# Patient Record
Sex: Male | Born: 1985 | Race: Black or African American | Hispanic: No | Marital: Married | Smoking: Current some day smoker
Health system: Southern US, Community
[De-identification: ages and names within clinical notes are randomized; demographics above are authoritative.]

---
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
## 2004-03-17 ENCOUNTER — Emergency Department (HOSPITAL_COMMUNITY): Admission: EM | Admit: 2004-03-17 | Discharge: 2004-03-17 | Payer: Self-pay

## 2004-05-11 ENCOUNTER — Emergency Department (HOSPITAL_COMMUNITY): Admission: EM | Admit: 2004-05-11 | Discharge: 2004-05-11 | Payer: Self-pay | Admitting: Emergency Medicine

## 2004-05-12 ENCOUNTER — Emergency Department (HOSPITAL_COMMUNITY): Admission: EM | Admit: 2004-05-12 | Discharge: 2004-05-12 | Payer: Self-pay | Admitting: Family Medicine

## 2004-08-07 ENCOUNTER — Emergency Department (HOSPITAL_COMMUNITY): Admission: EM | Admit: 2004-08-07 | Discharge: 2004-08-07 | Payer: Self-pay | Admitting: Family Medicine

## 2004-08-26 ENCOUNTER — Emergency Department (HOSPITAL_COMMUNITY): Admission: EM | Admit: 2004-08-26 | Discharge: 2004-08-26 | Payer: Self-pay | Admitting: Emergency Medicine

## 2005-02-01 ENCOUNTER — Emergency Department (HOSPITAL_COMMUNITY): Admission: EM | Admit: 2005-02-01 | Discharge: 2005-02-01 | Payer: Self-pay | Admitting: Emergency Medicine

## 2005-03-21 ENCOUNTER — Emergency Department (HOSPITAL_COMMUNITY): Admission: EM | Admit: 2005-03-21 | Discharge: 2005-03-21 | Payer: Self-pay | Admitting: Family Medicine

## 2005-11-25 ENCOUNTER — Emergency Department (HOSPITAL_COMMUNITY): Admission: EM | Admit: 2005-11-25 | Discharge: 2005-11-25 | Payer: Self-pay | Admitting: Family Medicine

## 2005-12-30 ENCOUNTER — Emergency Department (HOSPITAL_COMMUNITY): Admission: EM | Admit: 2005-12-30 | Discharge: 2005-12-30 | Payer: Self-pay | Admitting: Family Medicine

## 2006-05-15 ENCOUNTER — Emergency Department (HOSPITAL_COMMUNITY): Admission: EM | Admit: 2006-05-15 | Discharge: 2006-05-15 | Payer: Self-pay | Admitting: Family Medicine

## 2006-07-31 ENCOUNTER — Emergency Department (HOSPITAL_COMMUNITY): Admission: EM | Admit: 2006-07-31 | Discharge: 2006-07-31 | Payer: Self-pay | Admitting: Family Medicine

## 2006-12-11 ENCOUNTER — Emergency Department (HOSPITAL_COMMUNITY): Admission: EM | Admit: 2006-12-11 | Discharge: 2006-12-11 | Payer: Self-pay | Admitting: Family Medicine

## 2007-03-18 ENCOUNTER — Emergency Department (HOSPITAL_COMMUNITY): Admission: EM | Admit: 2007-03-18 | Discharge: 2007-03-18 | Payer: Self-pay | Admitting: Family Medicine

## 2007-07-01 ENCOUNTER — Emergency Department (HOSPITAL_COMMUNITY): Admission: EM | Admit: 2007-07-01 | Discharge: 2007-07-01 | Payer: Self-pay | Admitting: Emergency Medicine

## 2007-08-30 ENCOUNTER — Emergency Department (HOSPITAL_COMMUNITY): Admission: EM | Admit: 2007-08-30 | Discharge: 2007-08-30 | Payer: Self-pay | Admitting: Family Medicine

## 2007-11-08 ENCOUNTER — Emergency Department (HOSPITAL_COMMUNITY): Admission: EM | Admit: 2007-11-08 | Discharge: 2007-11-08 | Payer: Self-pay | Admitting: Emergency Medicine

## 2009-07-06 ENCOUNTER — Emergency Department (HOSPITAL_COMMUNITY): Admission: EM | Admit: 2009-07-06 | Discharge: 2009-07-06 | Payer: Self-pay | Admitting: Family Medicine

## 2009-07-25 ENCOUNTER — Emergency Department (HOSPITAL_COMMUNITY): Admission: EM | Admit: 2009-07-25 | Discharge: 2009-07-25 | Payer: Self-pay | Admitting: Family Medicine

## 2009-10-10 ENCOUNTER — Emergency Department (HOSPITAL_COMMUNITY): Admission: EM | Admit: 2009-10-10 | Discharge: 2009-10-10 | Payer: Self-pay | Admitting: Family Medicine

## 2010-04-22 ENCOUNTER — Emergency Department (HOSPITAL_COMMUNITY): Admission: EM | Admit: 2010-04-22 | Discharge: 2010-04-22 | Payer: Self-pay | Admitting: Emergency Medicine

## 2010-10-03 LAB — POCT H PYLORI SCREEN: H. PYLORI SCREEN, POC: POSITIVE — AB

## 2010-10-10 LAB — GC/CHLAMYDIA PROBE AMP, GENITAL: Chlamydia, DNA Probe: NEGATIVE

## 2010-10-18 LAB — GC/CHLAMYDIA PROBE AMP, GENITAL: GC Probe Amp, Genital: NEGATIVE

## 2011-01-01 ENCOUNTER — Inpatient Hospital Stay (INDEPENDENT_AMBULATORY_CARE_PROVIDER_SITE_OTHER)
Admission: RE | Admit: 2011-01-01 | Discharge: 2011-01-01 | Disposition: A | Discharge status: Home / Self Care | Payer: Medicaid Other | Source: Ambulatory Visit | Attending: Family Medicine | Admitting: Family Medicine

## 2011-01-01 DIAGNOSIS — K2990 Gastroduodenitis, unspecified, without bleeding: Secondary | ICD-10-CM

## 2011-01-01 DIAGNOSIS — J309 Allergic rhinitis, unspecified: Secondary | ICD-10-CM

## 2011-10-05 ENCOUNTER — Emergency Department (HOSPITAL_COMMUNITY)
Admission: EM | Admit: 2011-10-05 | Discharge: 2011-10-05 | Disposition: A | Discharge status: Home / Self Care | Payer: Medicaid Other | Source: Home / Self Care

## 2011-10-05 ENCOUNTER — Encounter (HOSPITAL_COMMUNITY): Payer: Self-pay | Admitting: Emergency Medicine

## 2011-10-05 DIAGNOSIS — J209 Acute bronchitis, unspecified: Secondary | ICD-10-CM

## 2011-10-05 DIAGNOSIS — K5289 Other specified noninfective gastroenteritis and colitis: Secondary | ICD-10-CM

## 2011-10-05 DIAGNOSIS — K529 Noninfective gastroenteritis and colitis, unspecified: Secondary | ICD-10-CM

## 2011-10-05 MED ORDER — ONDANSETRON HCL 4 MG/2ML IJ SOLN
4.0000 mg | Freq: Once | INTRAMUSCULAR | Status: AC
  Administered 2011-10-05: 4 mg via INTRAMUSCULAR

## 2011-10-05 MED ORDER — ONDANSETRON 8 MG PO TBDP: 8.0000 mg | ORAL_TABLET | Freq: Three times a day (TID) | ORAL | Status: AC | PRN

## 2011-10-05 MED ORDER — ONDANSETRON HCL 4 MG/2ML IJ SOLN
INTRAMUSCULAR | Status: AC
  Filled 2011-10-05: qty 2

## 2011-10-05 MED ORDER — AZITHROMYCIN 250 MG PO TABS: ORAL_TABLET | ORAL | Status: AC

## 2011-10-05 NOTE — ED Notes (Signed)
PO challenge initiated

## 2011-10-05 NOTE — ED Provider Notes (Signed)
History     CSN: [id number]  Arrival date & time 10/05/11  1910   None     Chief Complaint  Patient presents with  . Emesis  . Abdominal Pain    (Consider location/radiation/quality/duration/timing/severity/associated sxs/prior treatment) HPI Comments: Patient initially presents with complaints of nausea, vomiting, and abdominal pain for 2-3 days. He denied diarrhea. Then upon further questioning and history he admits that his symptoms began today. He has vomited 4 times, and has had 3 loose stools as well. No fever or chills. He is unable to retain any fluids. He did eat pizza and subs last night for dinner and wonders if this is contributing to his symptoms. He is also concerned about a cough that he has had for the last 1-2 weeks. He admits that he is a smoker. The cough is sometimes productive with purulent sputum.    Past Medical History  Diagnosis Date  . Asthma   . Chronic bronchitis     History reviewed. No pertinent past surgical history.  No family history on file.  History  Substance Use Topics  . Smoking status: Current Everyday Smoker -- 0.5 packs/day for 0 years    Types: Cigarettes  . Smokeless tobacco: Not on file  . Alcohol Use: Yes      Review of Systems  Constitutional: Negative for fever, chills and fatigue.  HENT: Negative for ear pain, sore throat, rhinorrhea, postnasal drip and sinus pressure.   Respiratory: Positive for cough. Negative for shortness of breath and wheezing.   Cardiovascular: Negative for chest pain.  Gastrointestinal: Positive for nausea, vomiting, abdominal pain and diarrhea.  Genitourinary: Negative for decreased urine volume.  Neurological: Positive for light-headedness. Negative for headaches.    Allergies  Seasonal  Home Medications   Current Outpatient Rx  Name Route Sig Dispense Refill  . AZITHROMYCIN 250 MG PO TABS  take 2 tabs today, then 1 tab daily x 4 days 6 each 0  . ONDANSETRON 8 MG PO TBDP Oral Take 1  tablet (8 mg total) by mouth every 8 (eight) hours as needed for nausea. 20 tablet 0    BP 98/66  Pulse 94  Temp(Src) 97.5 F (36.4 C) (Oral)  Resp 20  SpO2 96%  Physical Exam  Nursing note and vitals reviewed. Constitutional: He appears well-developed and well-nourished. No distress.       When I initially entered the exam room pt was vomiting. When vomiting episode stopped he was sitting on exam table, calm, alert and talkative.   HENT:  Head: Normocephalic and atraumatic.  Right Ear: Tympanic membrane, external ear and ear canal normal.  Left Ear: Tympanic membrane, external ear and ear canal normal.  Nose: Nose normal.  Mouth/Throat: Uvula is midline, oropharynx is clear and moist and mucous membranes are normal. No oropharyngeal exudate, posterior oropharyngeal edema or posterior oropharyngeal erythema.  Neck: Neck supple.  Cardiovascular: Normal rate, regular rhythm and normal heart sounds.   Pulmonary/Chest: Effort normal and breath sounds normal. No respiratory distress.  Abdominal: Soft. Bowel sounds are normal. He exhibits no distension and no mass. There is tenderness (mild, diffuse). There is no rebound and no guarding.  Lymphadenopathy:    He has no cervical adenopathy.  Neurological: He is alert.  Skin: Skin is warm and dry.  Psychiatric: He has a normal mood and affect.    ED Course  Procedures (including critical care time)  Labs Reviewed - No data to display No results found.   1. Gastroenteritis  2. Acute bronchitis       MDM   Pt reports significant improvement of nausea after Zofran. Tolerating po fluids.        Melody Comas, [place] 10/05/11 2129

## 2011-10-05 NOTE — ED Notes (Signed)
Pt complains of nausea and vomiting for two days.

## 2011-10-05 NOTE — Discharge Instructions (Signed)
Take Zofran as needed for nausea and vomiting. Clear fluids. It is important to stay well hydrated. You may begin a light bland diet as symptoms improve. Wait 24 hrs to begin the antibiotic prescription due to the vomiting you've had today. It may upset your stomach. Return if symptoms change or worsen. Stop smoking!

## 2011-10-05 NOTE — ED Notes (Signed)
Pt tolerated PO intake of ginger ale. No vomiting or dry heaving since given Zofran.

## 2011-10-06 NOTE — ED Provider Notes (Signed)
Medical screening examination/treatment/procedure(s) were performed by resident physician or non-physician practitioner and as supervising physician I was immediately available for consultation/collaboration.   Barkley Bruns MD.    Linna Hoff, MD 10/06/11 2112

## 2015-06-03 ENCOUNTER — Emergency Department (HOSPITAL_BASED_OUTPATIENT_CLINIC_OR_DEPARTMENT_OTHER): Payer: Medicaid Other

## 2015-06-03 ENCOUNTER — Encounter (HOSPITAL_BASED_OUTPATIENT_CLINIC_OR_DEPARTMENT_OTHER): Payer: Self-pay | Admitting: Emergency Medicine

## 2015-06-03 ENCOUNTER — Emergency Department (HOSPITAL_BASED_OUTPATIENT_CLINIC_OR_DEPARTMENT_OTHER)
Admission: EM | Admit: 2015-06-03 | Discharge: 2015-06-03 | Disposition: A | Discharge status: Home / Self Care | Payer: Medicaid Other | Attending: Emergency Medicine | Admitting: Emergency Medicine

## 2015-06-03 DIAGNOSIS — Z202 Contact with and (suspected) exposure to infections with a predominantly sexual mode of transmission: Secondary | ICD-10-CM | POA: Diagnosis not present

## 2015-06-03 DIAGNOSIS — R0981 Nasal congestion: Secondary | ICD-10-CM | POA: Insufficient documentation

## 2015-06-03 DIAGNOSIS — F17210 Nicotine dependence, cigarettes, uncomplicated: Secondary | ICD-10-CM | POA: Diagnosis not present

## 2015-06-03 DIAGNOSIS — R05 Cough: Secondary | ICD-10-CM | POA: Diagnosis present

## 2015-06-03 DIAGNOSIS — J45909 Unspecified asthma, uncomplicated: Secondary | ICD-10-CM | POA: Insufficient documentation

## 2015-06-03 MED ORDER — LIDOCAINE HCL (PF) 1 % IJ SOLN
INTRAMUSCULAR | Status: AC
  Administered 2015-06-03: 1.2 mL
  Filled 2015-06-03: qty 5

## 2015-06-03 MED ORDER — FLUTICASONE PROPIONATE 50 MCG/ACT NA SUSP: 2.0000 | Freq: Every day | NASAL | Status: DC

## 2015-06-03 MED ORDER — CEFTRIAXONE SODIUM 250 MG IJ SOLR
250.0000 mg | Freq: Once | INTRAMUSCULAR | Status: AC
  Administered 2015-06-03: 250 mg via INTRAMUSCULAR
  Filled 2015-06-03: qty 250

## 2015-06-03 MED ORDER — AZITHROMYCIN 250 MG PO TABS
1000.0000 mg | ORAL_TABLET | Freq: Once | ORAL | Status: AC
  Administered 2015-06-03: 1000 mg via ORAL
  Filled 2015-06-03: qty 4

## 2015-06-03 NOTE — Discharge Instructions (Signed)
Sexually Transmitted Disease °A sexually transmitted disease (STD) is a disease or infection that may be passed (transmitted) from person to person, usually during sexual activity. This may happen by way of saliva, semen, blood, vaginal mucus, or urine. Common STDs include: °· Gonorrhea. °· Chlamydia. °· Syphilis. °· HIV and AIDS. °· Genital herpes. °· Hepatitis B and C. °· Trichomonas. °· Human papillomavirus (HPV). °· Pubic lice. °· Scabies. °· Mites. °· Bacterial vaginosis. °WHAT ARE CAUSES OF STDs? °An STD may be caused by bacteria, a virus, or parasites. STDs are often transmitted during sexual activity if one person is infected. However, they may also be transmitted through nonsexual means. STDs may be transmitted after:  °· Sexual intercourse with an infected person. °· Sharing sex toys with an infected person. °· Sharing needles with an infected person or using unclean piercing or tattoo needles. °· Having intimate contact with the genitals, mouth, or rectal areas of an infected person. °· Exposure to infected fluids during birth. °WHAT ARE THE SIGNS AND SYMPTOMS OF STDs? °Different STDs have different symptoms. Some people may not have any symptoms. If symptoms are present, they may include: °· Painful or bloody urination. °· Pain in the pelvis, abdomen, vagina, anus, throat, or eyes. °· A skin rash, itching, or irritation. °· Growths, ulcerations, blisters, or sores in the genital and anal areas. °· Abnormal vaginal discharge with or without bad odor. °· Penile discharge in men. °· Fever. °· Pain or bleeding during sexual intercourse. °· Swollen glands in the groin area. °· Yellow skin and eyes (jaundice). This is seen with hepatitis. °· Swollen testicles. °· Infertility. °· Sores and blisters in the mouth. °HOW ARE STDs DIAGNOSED? °To make a diagnosis, your health care provider may: °· Take a medical history. °· Perform a physical exam. °· Take a sample of any discharge to examine. °· Swab the throat,  cervix, opening to the penis, rectum, or vagina for testing. °· Test a sample of your first morning urine. °· Perform blood tests. °· Perform a Pap test, if this applies. °· Perform a colposcopy. °· Perform a laparoscopy. °HOW ARE STDs TREATED? °Treatment depends on the STD. Some STDs may be treated but not cured. °· Chlamydia, gonorrhea, trichomonas, and syphilis can be cured with antibiotic medicine. °· Genital herpes, hepatitis, and HIV can be treated, but not cured, with prescribed medicines. The medicines lessen symptoms. °· Genital warts from HPV can be treated with medicine or by freezing, burning (electrocautery), or surgery. Warts may come back. °· HPV cannot be cured with medicine or surgery. However, abnormal areas may be removed from the cervix, vagina, or vulva. °· If your diagnosis is confirmed, your recent sexual partners need treatment. This is true even if they are symptom-free or have a negative culture or evaluation. They should not have sex until their health care providers say it is okay. °· Your health care provider may test you for infection again 3 months after treatment. °HOW CAN I REDUCE MY RISK OF GETTING AN STD? °Take these steps to reduce your risk of getting an STD: °· Use latex condoms, dental dams, and water-soluble lubricants during sexual activity. Do not use petroleum jelly or oils. °· Avoid having multiple sex partners. °· Do not have sex with someone who has other sex partners °· Do not have sex with anyone you do not know or who is at high risk for an STD. °· Avoid risky sex practices that can break your skin. °· Do not have sex   if you have open sores on your mouth or skin. °· Avoid drinking too much alcohol or taking illegal drugs. Alcohol and drugs can affect your judgment and put you in a vulnerable position. °· Avoid engaging in oral and anal sex acts. °· Get vaccinated for HPV and hepatitis. If you have not received these vaccines in the past, talk to your health care  provider about whether one or both might be right for you. °· If you are at risk of being infected with HIV, it is recommended that you take a prescription medicine daily to prevent HIV infection. This is called pre-exposure prophylaxis (PrEP). You are considered at risk if: °¨ You are a man who has sex with other men (MSM). °¨ You are a heterosexual man or woman and are sexually active with more than one partner. °¨ You take drugs by injection. °¨ You are sexually active with a partner who has HIV. °· Talk with your health care provider about whether you are at high risk of being infected with HIV. If you choose to begin PrEP, you should first be tested for HIV. You should then be tested every 3 months for as long as you are taking PrEP. °WHAT SHOULD I DO IF I THINK I HAVE AN STD? °· See your health care provider. °· Tell your sexual partner(s). They should be tested and treated for any STDs. °· Do not have sex until your health care provider says it is okay. °WHEN SHOULD I GET IMMEDIATE MEDICAL CARE? °Contact your health care provider right away if:  °· You have severe abdominal pain. °· You are a man and notice swelling or pain in your testicles. °· You are a woman and notice swelling or pain in your vagina. °  °This information is not intended to replace advice given to you by your health care provider. Make sure you discuss any questions you have with your health care provider. °  °Document Released: 09/24/2002 Document Revised: 07/25/2014 Document Reviewed: 01/22/2013 °Elsevier Interactive Patient Education ©2016 [employer] ° °

## 2015-06-03 NOTE — ED Notes (Signed)
29 yo with cough and congestion for 3 days. States yellow productive cough and runny nose. Has not taken any OTC meds

## 2015-06-03 NOTE — ED Provider Notes (Signed)
CSN: [id number]     Arrival date & time 06/03/15  2051 History  By signing my name below, I, Soijett Blue, attest that this documentation has been prepared under the direction and in the presence of Lyndal Pulleyaniel Erionna Strum, MD. Electronically Signed: Soijett Blue, ED Scribe. 06/03/2015. 10:30 PM.   Chief Complaint  Patient presents with  . Cough      Patient is a 29 y.o. male presenting with cough. The history is provided by the patient. No language interpreter was used.  Cough Cough characteristics:  Productive Sputum characteristics:  Yellow Severity:  Moderate Onset quality:  Sudden Duration:  3 days Progression:  Unchanged Chronicity:  Recurrent Relieved by:  None tried Worsened by:  Nothing tried Ineffective treatments:  None tried Associated symptoms: sinus congestion     Robert KosRobert A Lutz is a 29 y.o. male with a medical hx of asthma, chronic bronchitis, who presents to the Emergency Department complaining of exposure to STD. He notes that he had sex recently with unprotected sex 2 times within the past 2 weeks. He notes that he would like to make sure that he doesn't have anything at this time. He denies any associated symptoms at this time. He denies penile pain/swelling, testicular pain/swelling, abdominal pain, n/v, and any other symptoms.   Past Medical History  Diagnosis Date  . Asthma   . Chronic bronchitis    History reviewed. No pertinent past surgical history. No family history on file. Social History  Substance Use Topics  . Smoking status: Current Some Day Smoker -- 0.50 packs/day for 0 years    Types: Cigarettes  . Smokeless tobacco: None  . Alcohol Use: Yes    Review of Systems  Respiratory: Positive for cough.   Genitourinary: Negative for dysuria, hematuria, discharge, penile swelling, scrotal swelling, penile pain and testicular pain.  All other systems reviewed and are negative.    Allergies  Review of patient's allergies indicates no active  allergies.  Home Medications   Prior to Admission medications   Not on File   BP 121/68 mmHg  Pulse 78  Temp(Src) 98.2 F (36.8 C)  Resp 18  Ht 5\' 7"  (1.702 m)  Wt 175 lb (79.379 kg)  BMI 27.40 kg/m2  SpO2 99% Physical Exam  Constitutional: He is oriented to person, place, and time. He appears well-developed and well-nourished. No distress.  HENT:  Head: Normocephalic and atraumatic.  Mouth/Throat: Uvula is midline, oropharynx is clear and moist and mucous membranes are normal.  Eyes: EOM are normal.  Neck: Neck supple.  Cardiovascular: Normal rate, regular rhythm and normal heart sounds.  Exam reveals no gallop and no friction rub.   No murmur heard. Pulmonary/Chest: Effort normal and breath sounds normal. No respiratory distress. He has no wheezes. He has no rales.  Abdominal: Soft. There is no tenderness.  Musculoskeletal: Normal range of motion.  Neurological: He is alert and oriented to person, place, and time.  Skin: Skin is warm and dry.  Psychiatric: He has a normal mood and affect. His behavior is normal.  Nursing note and vitals reviewed.   ED Course  Procedures (including critical care time) DIAGNOSTIC STUDIES: Oxygen Saturation is 99% on RA, nl by my interpretation.    COORDINATION OF CARE: 10:19 PM Discussed treatment plan with pt at bedside which includes nasal spray, zyrtec, and pt agreed to plan.    Labs Review Labs Reviewed  GC/CHLAMYDIA PROBE AMP ([place]    Imaging Review Dg Chest 2 View  06/03/2015  CLINICAL DATA:  Cough and chest congestion for 2 days EXAM: CHEST  2 VIEW COMPARISON:  11/08/2007 FINDINGS: The heart size and mediastinal contours are within normal limits. Both lungs are clear. The visualized skeletal structures are unremarkable. IMPRESSION: No active cardiopulmonary disease. Electronically Signed   By: Myles Rosenthal M.D.   On: 06/03/2015 22:07   I have personally reviewed and evaluated these images as part of my  medical decision-making.   EKG Interpretation None      MDM   Final diagnoses:  Nasal congestion  Exposure to STD    29 year old male presents with ongoing nasal congestion since starting smoking and mild cough. Chest x-ray from triage is unremarkable. He is afebrile and otherwise well-appearing. He is secondary complaint that he may have been exposed to an STD with breakage of the condom recently. He is a symptomatically currently but his partner has symptoms so he was covered empirically with antibiotics for GC and chlamydia. He was given nasal spray to help with his congestion symptoms. Patient needs to establish primary care in the area and was provided contact information to do so.    I personally performed the services described in this documentation, which was scribed in my presence. The recorded information has been reviewed and is accurate.       Lyndal Pulley, MD 06/04/15 [phone]

## 2015-06-04 LAB — GC/CHLAMYDIA PROBE AMP (~~LOC~~) NOT AT ARMC
Chlamydia: NEGATIVE
Neisseria Gonorrhea: NEGATIVE

## 2016-06-29 ENCOUNTER — Emergency Department (HOSPITAL_COMMUNITY)
Admission: EM | Admit: 2016-06-29 | Discharge: 2016-06-29 | Disposition: A | Discharge status: Home / Self Care | Payer: Medicaid Other | Attending: Emergency Medicine | Admitting: Emergency Medicine

## 2016-06-29 ENCOUNTER — Emergency Department (HOSPITAL_COMMUNITY): Payer: Medicaid Other

## 2016-06-29 ENCOUNTER — Encounter (HOSPITAL_COMMUNITY): Payer: Self-pay | Admitting: Emergency Medicine

## 2016-06-29 DIAGNOSIS — F17210 Nicotine dependence, cigarettes, uncomplicated: Secondary | ICD-10-CM | POA: Diagnosis not present

## 2016-06-29 DIAGNOSIS — L02214 Cutaneous abscess of groin: Secondary | ICD-10-CM | POA: Insufficient documentation

## 2016-06-29 DIAGNOSIS — J45909 Unspecified asthma, uncomplicated: Secondary | ICD-10-CM | POA: Insufficient documentation

## 2016-06-29 DIAGNOSIS — R1904 Left lower quadrant abdominal swelling, mass and lump: Secondary | ICD-10-CM | POA: Diagnosis present

## 2016-06-29 DIAGNOSIS — R52 Pain, unspecified: Secondary | ICD-10-CM

## 2016-06-29 LAB — CBC WITH DIFFERENTIAL/PLATELET
Basophils Absolute: 0 10*3/uL (ref 0.0–0.1)
Basophils Relative: 0 %
EOS PCT: 1 %
Eosinophils Absolute: 0.1 10*3/uL (ref 0.0–0.7)
HCT: 42.7 % (ref 39.0–52.0)
Hemoglobin: 14.5 g/dL (ref 13.0–17.0)
LYMPHS ABS: 1.6 10*3/uL (ref 0.7–4.0)
LYMPHS PCT: 15 %
MCH: 31 pg (ref 26.0–34.0)
MCHC: 34 g/dL (ref 30.0–36.0)
MCV: 91.4 fL (ref 78.0–100.0)
MONO ABS: 0.9 10*3/uL (ref 0.1–1.0)
Monocytes Relative: 8 %
Neutro Abs: 8.2 10*3/uL — ABNORMAL HIGH (ref 1.7–7.7)
Neutrophils Relative %: 76 %
PLATELETS: 312 10*3/uL (ref 150–400)
RBC: 4.67 MIL/uL (ref 4.22–5.81)
RDW: 13.3 % (ref 11.5–15.5)
WBC: 10.9 10*3/uL — ABNORMAL HIGH (ref 4.0–10.5)

## 2016-06-29 LAB — BASIC METABOLIC PANEL
Anion gap: 9 (ref 5–15)
BUN: 10 mg/dL (ref 6–20)
CALCIUM: 9.6 mg/dL (ref 8.9–10.3)
CO2: 26 mmol/L (ref 22–32)
Chloride: 102 mmol/L (ref 101–111)
Creatinine, Ser: 0.86 mg/dL (ref 0.61–1.24)
GFR calc Af Amer: >60 mL/min (ref >60)
GLUCOSE: 98 mg/dL (ref 65–99)
POTASSIUM: 3.7 mmol/L (ref 3.5–5.1)
Sodium: 137 mmol/L (ref 135–145)

## 2016-06-29 MED ORDER — HYDROMORPHONE HCL 1 MG/ML IJ SOLN: 0.5000 mg | Freq: Once | INTRAMUSCULAR | Status: DC

## 2016-06-29 MED ORDER — HYDROCODONE-ACETAMINOPHEN 5-325 MG PO TABS: ORAL_TABLET | ORAL | 0 refills | Status: DC

## 2016-06-29 MED ORDER — LIDOCAINE HCL 1 % IJ SOLN
INTRAMUSCULAR | Status: AC
  Administered 2016-06-29: 20 mL
  Filled 2016-06-29: qty 20

## 2016-06-29 MED ORDER — SULFAMETHOXAZOLE-TRIMETHOPRIM 800-160 MG PO TABS
1.0000 | ORAL_TABLET | Freq: Once | ORAL | Status: AC
  Administered 2016-06-29: 1 via ORAL
  Filled 2016-06-29: qty 1

## 2016-06-29 MED ORDER — ONDANSETRON 4 MG PO TBDP: 2.0000 mg | ORAL_TABLET | Freq: Once | ORAL | Status: DC

## 2016-06-29 MED ORDER — SULFAMETHOXAZOLE-TRIMETHOPRIM 800-160 MG PO TABS: 2.0000 | ORAL_TABLET | Freq: Two times a day (BID) | ORAL | 0 refills | Status: DC

## 2016-06-29 MED ORDER — LIDOCAINE HCL (PF) 1 % IJ SOLN: 5.0000 mL | Freq: Once | INTRAMUSCULAR | Status: DC

## 2016-06-29 NOTE — ED Notes (Signed)
Bed: WA10 Expected date:  Expected time:  Means of arrival:  Comments: Hold for triage  

## 2016-06-29 NOTE — ED Triage Notes (Signed)
Per pt, states abscess on left scrotum-states he noticed 2 days ago

## 2016-06-29 NOTE — ED Provider Notes (Signed)
[facility]-EMERGENCY DEPT Provider Note   CSN: [id number] Arrival date & time: 06/29/16  1043     History   Chief Complaint Chief Complaint  Patient presents with  . scrotal abscess    HPI   Blood pressure 125/71, pulse 74, temperature 98.2 F (36.8 C), temperature source Oral, resp. rate 16, height 5\' 8"  (1.727 m), weight 79.4 kg, SpO2 100 %.  Robert Lutz is a 30 y.o. male complaining of Painful swelling to the left inguinal area worsening over the course of 3 days, patient reports tactile fever, severe pain. No active drainage. He denies testicular pain or swelling urethral discharge, concerns about STDs no change in bowel or bladder habits and no abdominal pain.   Past Medical History:  Diagnosis Date  . Asthma   . Chronic bronchitis     There are no active problems to display for this patient.   History reviewed. No pertinent surgical history.     Home Medications    Prior to Admission medications   Medication Sig Start Date End Date Taking? Authorizing Provider  fluticasone (FLONASE) 50 MCG/ACT nasal spray Place 2 sprays into both nostrils daily. 06/03/15   Lyndal Pulley, MD  HYDROcodone-acetaminophen (NORCO/VICODIN) 5-325 MG tablet Take 1-2 tablets by mouth every 6 hours as needed for pain and/or cough. 06/29/16   Zakyra Kukuk, PA-C  sulfamethoxazole-trimethoprim (BACTRIM DS) 800-160 MG tablet Take 2 tablets by mouth 2 (two) times daily. 06/29/16   Joni Reining Makari Portman, PA-C    Family History No family history on file.  Social History Social History  Substance Use Topics  . Smoking status: Current Some Day Smoker    Packs/day: 0.50    Years: 0.00    Types: Cigarettes  . Smokeless tobacco: Not on file  . Alcohol use Yes     Allergies   Patient has no active allergies.   Review of Systems Review of Systems  10 systems reviewed and found to be negative, except as noted in the HPI.   Physical Exam Updated Vital Signs BP 125/71 (BP Location:  Left Arm)   Pulse 74   Temp 98.2 F (36.8 C) (Oral)   Resp 16   Ht 5\' 8"  (1.727 m)   Wt 79.4 kg   SpO2 100%   BMI 26.61 kg/m   Physical Exam  Constitutional: He is oriented to person, place, and time. He appears well-developed and well-nourished. No distress.  HENT:  Head: Normocephalic and atraumatic.  Mouth/Throat: Oropharynx is clear and moist.  Eyes: Conjunctivae and EOM are normal. Pupils are equal, round, and reactive to light.  Neck: Normal range of motion.  Cardiovascular: Normal rate, regular rhythm and intact distal pulses.   Pulmonary/Chest: Effort normal and breath sounds normal.  Abdominal: Soft. There is no tenderness.  Genitourinary:     Musculoskeletal: Normal range of motion.  Neurological: He is alert and oriented to person, place, and time.     Skin: He is not diaphoretic.  Psychiatric: He has a normal mood and affect.  Nursing note and vitals reviewed.    ED Treatments / Results  Labs (all labs ordered are listed, but only abnormal results are displayed) Labs Reviewed  CBC WITH DIFFERENTIAL/PLATELET - Abnormal; Notable for the following:       Result Value   WBC 10.9 (*)    Neutro Abs 8.2 (*)    All other components within normal limits  BASIC METABOLIC PANEL    EKG  EKG Interpretation None  Radiology [place] Scrotum  Result Date: 06/29/2016 CLINICAL DATA:  Pain and abscess EXAM: SCROTAL ULTRASOUND DOPPLER ULTRASOUND OF THE TESTICLES TECHNIQUE: Complete ultrasound examination of the testicles, epididymis, and other scrotal structures was performed. Color and spectral Doppler ultrasound were also utilized to evaluate blood flow to the testicles. COMPARISON:  None FINDINGS: Right testicle Measurements: 4.4 x 2.2 x 3.3 cm. No mass or microlithiasis visualized. Left testicle Measurements: 3.8 x 2.1 x 3.0 cm. No mass or microlithiasis visualized. Right epididymis:  Normal in size and appearance. Left epididymis:  Normal in size and appearance.  Hydrocele:  Small left hydrocele. Varicocele:  None visualized. Pulsed Doppler interrogation of both testes demonstrates normal low resistance arterial and venous waveforms bilaterally. There is a complex soft tissue area in the left groin, measuring 3.9 x 1.7 x 2.0 cm. It is difficult to characterize sonographically. This could reflect a complex fluid collection such as abscess or be related to a hernia. May consider further evaluation with CT. IMPRESSION: 3.9 cm complex fluid collection in the left groin superior to the left scrotum. Differential considerations would include a hernia for abscess. May consider further evaluation with CT. No testicular abnormality. Small left hydrocele. Electronically Signed   By: Charlett NoseKevin  Dover M.D.   On: 06/29/2016 12:47   [place] Art/ven Flow Abd Pelv Doppler  Result Date: 06/29/2016 CLINICAL DATA:  Pain and abscess EXAM: SCROTAL ULTRASOUND DOPPLER ULTRASOUND OF THE TESTICLES TECHNIQUE: Complete ultrasound examination of the testicles, epididymis, and other scrotal structures was performed. Color and spectral Doppler ultrasound were also utilized to evaluate blood flow to the testicles. COMPARISON:  None FINDINGS: Right testicle Measurements: 4.4 x 2.2 x 3.3 cm. No mass or microlithiasis visualized. Left testicle Measurements: 3.8 x 2.1 x 3.0 cm. No mass or microlithiasis visualized. Right epididymis:  Normal in size and appearance. Left epididymis:  Normal in size and appearance. Hydrocele:  Small left hydrocele. Varicocele:  None visualized. Pulsed Doppler interrogation of both testes demonstrates normal low resistance arterial and venous waveforms bilaterally. There is a complex soft tissue area in the left groin, measuring 3.9 x 1.7 x 2.0 cm. It is difficult to characterize sonographically. This could reflect a complex fluid collection such as abscess or be related to a hernia. May consider further evaluation with CT. IMPRESSION: 3.9 cm complex fluid collection in the left  groin superior to the left scrotum. Differential considerations would include a hernia for abscess. May consider further evaluation with CT. No testicular abnormality. Small left hydrocele. Electronically Signed   By: Charlett NoseKevin  Dover M.D.   On: 06/29/2016 12:47    Procedures .Marland Kitchen.Incision and Drainage Date/Time: 06/30/2016 7:53 AM Performed by: Wynetta EmeryPISCIOTTA, Delaynee Alred Authorized by: Wynetta EmeryPISCIOTTA, Torell Minder   Consent:    Consent obtained:  Verbal   Consent given by:  Patient   Alternatives discussed:  No treatment Location:    Type:  Abscess   Size:  3cm Anesthesia (see MAR for exact dosages):    Anesthesia method:  Local infiltration   Local anesthetic:  Bupivacaine 0.25% w/o epi Procedure type:    Complexity:  Complex Procedure details:    Needle aspiration: no     Incision types:  Elliptical   Scalpel blade:  11   Wound management:  Probed and deloculated, irrigated with saline and extensive cleaning   Drainage:  Purulent   Drainage amount:  Moderate   Wound treatment:  Wound left open   Packing materials:  None Post-procedure details:    Patient tolerance of procedure:  Tolerated well,  no immediate complications   (including critical care time)  Medications Ordered in ED Medications  lidocaine (XYLOCAINE) 1 % (with pres) injection (20 mLs  Given 06/29/16 1430)  sulfamethoxazole-trimethoprim (BACTRIM DS,SEPTRA DS) 800-160 MG per tablet 1 tablet (1 tablet Oral Given 06/29/16 1453)     Initial Impression / Assessment and Plan / ED Course  I have reviewed the triage vital signs and the nursing notes.  Pertinent labs & imaging results that were available during my care of the patient were reviewed by me and considered in my medical decision making (see chart for details).  Clinical Course     Vitals:   06/29/16 1104 06/29/16 1356 06/29/16 1459  BP: 112/72  125/71  Pulse: 87  74  Resp: 18  16  Temp: 97.8 F (36.6 C)  98.2 F (36.8 C)  TempSrc: Oral  Oral  SpO2: 100%  100%    Weight:  79.4 kg   Height:  5\' 8"  (1.727 m)     Medications  lidocaine (XYLOCAINE) 1 % (with pres) injection (20 mLs  Given 06/29/16 1430)  sulfamethoxazole-trimethoprim (BACTRIM DS,SEPTRA DS) 800-160 MG per tablet 1 tablet (1 tablet Oral Given 06/29/16 1453)    Robert Lutz is 30 y.o. male presenting with Inguinal abscess, not affecting the scrotum. Ultrasound with fluid collection in the left groin differential hernia versus abscess, clinically this is clearly an abscess, I and D performed and large amount of purulent material was obtained, patient feels much better after incision and drainage. Started on Bactrim and discussed wound care and return precautions.  Evaluation does not show pathology that would require ongoing emergent intervention or inpatient treatment. Pt is hemodynamically stable and mentating appropriately. Discussed findings and plan with patient/guardian, who agrees with care plan. All questions answered. Return precautions discussed and outpatient follow up given.      Final Clinical Impressions(s) / ED Diagnoses   Final diagnoses:  Inguinal abscess    New Prescriptions Discharge Medication List as of 06/29/2016  2:31 PM    START taking these medications   Details  HYDROcodone-acetaminophen (NORCO/VICODIN) 5-325 MG tablet Take 1-2 tablets by mouth every 6 hours as needed for pain and/or cough., Print    sulfamethoxazole-trimethoprim (BACTRIM DS) 800-160 MG tablet Take 2 tablets by mouth 2 (two) times daily., Starting Wed 06/29/2016, [employer]         Kaylee Wombles, PA-C 06/30/16 [id number]    Tilden FossaElizabeth Rees, MD 07/03/16 [phone]

## 2016-06-29 NOTE — Discharge Instructions (Signed)
Take vicodin for breakthrough pain, do not drink alcohol, drive, care for children or do other critical tasks while taking vicodin. ° °Please follow with your primary care doctor in the next 2 days for a check-up. They must obtain records for further management.  ° °Do not hesitate to return to the Emergency Department for any new, worsening or concerning symptoms.  ° °

## 2016-06-29 NOTE — ED Notes (Signed)
Patient verbally abusive to staff-complaining about wait time-disrespectful to radiology staff-when RN attempted to explain ED process/s patient cursed and made an inappropriate face-unresponsive to redirection

## 2016-06-29 NOTE — ED Notes (Signed)
No answer for blood draw @ this time. 

## 2018-01-09 ENCOUNTER — Emergency Department (HOSPITAL_COMMUNITY)
Admission: EM | Admit: 2018-01-09 | Discharge: 2018-01-09 | Disposition: A | Discharge status: Home / Self Care | Payer: Medicaid Other | Attending: Emergency Medicine | Admitting: Emergency Medicine

## 2018-01-09 ENCOUNTER — Encounter (HOSPITAL_COMMUNITY): Payer: Self-pay | Admitting: Emergency Medicine

## 2018-01-09 ENCOUNTER — Other Ambulatory Visit: Payer: Self-pay

## 2018-01-09 DIAGNOSIS — M542 Cervicalgia: Secondary | ICD-10-CM

## 2018-01-09 DIAGNOSIS — T148XXA Other injury of unspecified body region, initial encounter: Secondary | ICD-10-CM

## 2018-01-09 DIAGNOSIS — Y929 Unspecified place or not applicable: Secondary | ICD-10-CM | POA: Diagnosis not present

## 2018-01-09 DIAGNOSIS — J45909 Unspecified asthma, uncomplicated: Secondary | ICD-10-CM | POA: Diagnosis not present

## 2018-01-09 DIAGNOSIS — Y998 Other external cause status: Secondary | ICD-10-CM | POA: Insufficient documentation

## 2018-01-09 DIAGNOSIS — F17210 Nicotine dependence, cigarettes, uncomplicated: Secondary | ICD-10-CM | POA: Insufficient documentation

## 2018-01-09 DIAGNOSIS — S40812A Abrasion of left upper arm, initial encounter: Secondary | ICD-10-CM | POA: Diagnosis not present

## 2018-01-09 DIAGNOSIS — Y939 Activity, unspecified: Secondary | ICD-10-CM | POA: Diagnosis not present
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
## 2018-01-09 NOTE — Discharge Instructions (Signed)
You may alternate taking Tylenol and Ibuprofen as needed for pain control. You may take 400-600 mg of ibuprofen every 6 hours and [phone] mg of Tylenol every 6 hours. Do not exceed 4000 mg of Tylenol daily as this can lead to liver damage. Also, make sure to take Ibuprofen with meals as it can cause an upset stomach. Do not take other NSAIDs while taking Ibuprofen such as (Aleve, Naprosyn, Aspirin, Celebrex, etc) and do not take more than the prescribed dose as this can lead to ulcers and bleeding in your GI tract. You may use warm and cold compresses to help with your symptoms.   Please follow the instructions for wound care as outlined in your discharge paperwork for your abrasion to your arm.  Please follow up with your primary doctor within the next 7-10 days for re-evaluation and further treatment of your symptoms.   Please return to the ER sooner if you have any new or worsening symptoms.

## 2019-08-31 ENCOUNTER — Other Ambulatory Visit: Payer: Self-pay

## 2019-08-31 ENCOUNTER — Emergency Department (HOSPITAL_COMMUNITY): Payer: Medicaid Other

## 2019-08-31 ENCOUNTER — Emergency Department (HOSPITAL_COMMUNITY)
Admission: EM | Admit: 2019-08-31 | Discharge: 2019-08-31 | Disposition: A | Discharge status: Home / Self Care | Payer: Medicaid Other | Attending: Emergency Medicine | Admitting: Emergency Medicine

## 2019-08-31 ENCOUNTER — Encounter (HOSPITAL_COMMUNITY): Payer: Self-pay | Admitting: Emergency Medicine

## 2019-08-31 DIAGNOSIS — Z79899 Other long term (current) drug therapy: Secondary | ICD-10-CM | POA: Insufficient documentation

## 2019-08-31 DIAGNOSIS — F17210 Nicotine dependence, cigarettes, uncomplicated: Secondary | ICD-10-CM | POA: Insufficient documentation

## 2019-08-31 DIAGNOSIS — Y93I9 Activity, other involving external motion: Secondary | ICD-10-CM | POA: Insufficient documentation

## 2019-08-31 DIAGNOSIS — J45909 Unspecified asthma, uncomplicated: Secondary | ICD-10-CM | POA: Insufficient documentation

## 2019-08-31 DIAGNOSIS — Y999 Unspecified external cause status: Secondary | ICD-10-CM | POA: Insufficient documentation

## 2019-08-31 DIAGNOSIS — S0990XA Unspecified injury of head, initial encounter: Secondary | ICD-10-CM | POA: Diagnosis present

## 2019-08-31 DIAGNOSIS — S060X0A Concussion without loss of consciousness, initial encounter: Secondary | ICD-10-CM | POA: Diagnosis not present

## 2019-08-31 DIAGNOSIS — Y92410 Unspecified street and highway as the place of occurrence of the external cause: Secondary | ICD-10-CM | POA: Insufficient documentation

## 2019-08-31 NOTE — Discharge Instructions (Addendum)
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
## 2019-12-20 ENCOUNTER — Other Ambulatory Visit: Payer: Self-pay

## 2019-12-20 ENCOUNTER — Encounter (HOSPITAL_COMMUNITY): Payer: Self-pay

## 2019-12-20 ENCOUNTER — Encounter: Payer: Self-pay | Admitting: Gastroenterology

## 2019-12-20 ENCOUNTER — Ambulatory Visit (HOSPITAL_COMMUNITY)
Admission: EM | Admit: 2019-12-20 | Discharge: 2019-12-20 | Disposition: A | Discharge status: Home / Self Care | Payer: Medicaid Other | Attending: Family Medicine | Admitting: Family Medicine

## 2019-12-20 DIAGNOSIS — Z8619 Personal history of other infectious and parasitic diseases: Secondary | ICD-10-CM | POA: Diagnosis not present

## 2019-12-20 NOTE — Discharge Instructions (Signed)
Need to have repeat testing for H. Pylori This needs to be done 30 days after treatment at least 2 weeks after finishing your omeprazole You can either have a breath test, a stool test, or see a gastroenterologist We're unable to do the testing at this office. I will give you the number of a GI doctor to help you I have included information regarding an eating plan for your brain health as we discussed Avoid alcohol and cigarette smoking

## 2019-12-20 NOTE — ED Triage Notes (Signed)
Pt presents with follow up for bacterial infection: pt states he was seen at [facility] about a month ago and treated for H.Pylori and completed antibiotics.

## 2019-12-20 NOTE — ED Provider Notes (Signed)
[facility]    CSN: [id number] Arrival date & time: 12/20/19  1204      History   Chief Complaint Chief Complaint  Patient presents with  . Follow-up    HPI LORAN FLEET is a 34 y.o. male.   HPI  Mr. Baccam is a healthy 34 year old male He states that he usually goes to [facility] urgent care for his medical needs He states he went there for a sinus infection a couple months back.  He mentioned at the time he was having heartburn.  They did a breath urea test and it was positive.  He was told he had H. pylori and was given a course of antibiotics. He went back for follow-up a month ago.  They retested him, and he is positive on his bathroom test second time.  He was treated with a second course of antibiotics.  He finished these 3 days ago he is unable to tell me whether he had different antibiotics the first time in the second time, and he is unable to tell me what medicines he took but does feel like one of them was omeprazole He feels well.  No heartburn or GI symptoms, no abdominal pain, no weight loss   Past Medical History:  Diagnosis Date  . Asthma   . Chronic bronchitis     There are no problems to display for this patient.   History reviewed. No pertinent surgical history.     Home Medications    Prior to Admission medications   Medication Sig Start Date End Date Taking? Authorizing Provider  fluticasone (FLONASE) 50 MCG/ACT nasal spray Place 2 sprays into both nostrils daily. 06/03/15 12/20/19  Leo Grosser, MD    Family History Family History  Family history unknown: Yes    Social History Social History   Tobacco Use  . Smoking status: Current Some Day Smoker    Packs/day: 0.50    Years: 0.00    Pack years: 0.00    Types: Cigarettes  . Smokeless tobacco: Never Used  Substance Use Topics  . Alcohol use: Yes  . Drug use: Yes    Types: Marijuana     Allergies   Patient has no known allergies.   Review of Systems Review of  Systems  Gastrointestinal: Negative for abdominal pain, nausea and vomiting.     Physical Exam Triage Vital Signs ED Triage Vitals  Enc Vitals Group     BP 12/20/19 1235 132/82     Pulse Rate 12/20/19 1235 100     Resp 12/20/19 1235 18     Temp 12/20/19 1235 98.6 F (37 C)     Temp Source 12/20/19 1235 Oral     SpO2 12/20/19 1235 99 %     Weight --      Height --      Head Circumference --      Peak Flow --      Pain Score 12/20/19 1233 3     Pain Loc --      Pain Edu? --      Excl. in [place]? --    No data found.  Updated Vital Signs BP 132/82 (BP Location: Right Arm)   Pulse 100   Temp 98.6 F (37 C) (Oral)   Resp 18   SpO2 99%      Physical Exam Constitutional:      General: He is not in acute distress.    Appearance: He is well-developed.  HENT:  Head: Normocephalic and atraumatic.     Mouth/Throat:     Comments: Mask is in place Eyes:     Conjunctiva/sclera: Conjunctivae normal.     Pupils: Pupils are equal, round, and reactive to light.  Cardiovascular:     Rate and Rhythm: Tachycardia present.     Heart sounds: Normal heart sounds.  Pulmonary:     Effort: Pulmonary effort is normal. No respiratory distress.     Breath sounds: Normal breath sounds.  Musculoskeletal:        General: Normal range of motion.     Cervical back: Normal range of motion.  Skin:    General: Skin is warm and dry.  Neurological:     Mental Status: He is alert.  Psychiatric:        Mood and Affect: Mood normal.        Behavior: Behavior normal.      UC Treatments / Results  Labs (all labs ordered are listed, but only abnormal results are displayed) Labs Reviewed - No data to display  EKG   Radiology No results found.  Procedures Procedures (including critical care time)  Medications Ordered in UC Medications - No data to display  Initial Impression / Assessment and Plan / UC Course  I have reviewed the triage vital signs and the nursing notes.  Pertinent  labs & imaging results that were available during my care of the patient were reviewed by me and considered in my medical decision making (see chart for details).      We reviewed the benefits of smoking cessation With recurring H. pylori I also recommend he do not drink alcohol I am referring him to gastroenterology Stop all GI medications at this time so repeat testing can be done in a month  Final Clinical Impressions(s) / UC Diagnoses   Final diagnoses:  History of Helicobacter pylori infection     Discharge Instructions     Need to have repeat testing for H. Pylori This needs to be done 30 days after treatment at least 2 weeks after finishing your omeprazole You can either have a breath test, a stool test, or see a gastroenterologist We're unable to do the testing at this office. I will give you the number of a GI doctor to help you I have included information regarding an eating plan for your brain health as we discussed Avoid alcohol and cigarette smoking   ED Prescriptions    None     PDMP not reviewed this encounter.   Eustace Moore, MD 12/20/19 [phone]

## 2020-02-03 ENCOUNTER — Other Ambulatory Visit: Payer: Self-pay

## 2020-02-03 DIAGNOSIS — Z8619 Personal history of other infectious and parasitic diseases: Secondary | ICD-10-CM

## 2020-02-03 DIAGNOSIS — J45909 Unspecified asthma, uncomplicated: Secondary | ICD-10-CM | POA: Insufficient documentation

## 2020-02-03 DIAGNOSIS — J42 Unspecified chronic bronchitis: Secondary | ICD-10-CM | POA: Insufficient documentation

## 2020-02-03 HISTORY — DX: Personal history of other infectious and parasitic diseases: Z86.19

## 2020-02-05 ENCOUNTER — Ambulatory Visit: Payer: Medicaid Other | Admitting: Gastroenterology

## 2020-08-05 ENCOUNTER — Encounter (HOSPITAL_COMMUNITY): Payer: Self-pay

## 2020-08-05 ENCOUNTER — Ambulatory Visit (HOSPITAL_COMMUNITY)
Admission: EM | Admit: 2020-08-05 | Discharge: 2020-08-05 | Disposition: A | Discharge status: Home / Self Care | Payer: Medicaid Other | Attending: Emergency Medicine | Admitting: Emergency Medicine

## 2020-08-05 DIAGNOSIS — L03211 Cellulitis of face: Secondary | ICD-10-CM | POA: Diagnosis not present

## 2020-08-05 MED ORDER — DOXYCYCLINE HYCLATE 100 MG PO CAPS: 100.0000 mg | ORAL_CAPSULE | Freq: Two times a day (BID) | ORAL | 0 refills | Status: DC

## 2020-08-05 MED ORDER — IBUPROFEN 800 MG PO TABS
800.0000 mg | ORAL_TABLET | Freq: Once | ORAL | Status: AC
  Administered 2020-08-05: 800 mg via ORAL

## 2020-08-05 MED ORDER — IBUPROFEN 800 MG PO TABS: 800.0000 mg | ORAL_TABLET | Freq: Three times a day (TID) | ORAL | 0 refills | Status: DC | PRN

## 2020-08-05 MED ORDER — IBUPROFEN 800 MG PO TABS
ORAL_TABLET | ORAL | Status: AC
  Filled 2020-08-05: qty 1

## 2020-08-05 NOTE — ED Triage Notes (Signed)
Pt c/o upper lip swelling for 2 days, then spread to frenum, nostrils, c/o pain to left side of head/face, neck for approx 2 days. Reports intense pain to lip and nares area.  Reports recent post-nasal drip, runny nose.   Pt denies difficulty swallowing, SOB, nausea/abdominal pain, fever, n/v, cough.

## 2020-08-05 NOTE — Discharge Instructions (Addendum)
Apply heat, as able, if this can drain that will help it heal.  Ibuprofen as needed for pain, take with food, don't take for another 8 hours.  Complete course of antibiotics.  Follow up with ENT if persistent, or return for any worsening of symptoms.

## 2020-08-05 NOTE — ED Provider Notes (Signed)
[facility]    CSN: [id number] Arrival date & time: 08/05/20  0909      History   Chief Complaint Chief Complaint  Patient presents with  . Facial Swelling    HPI Robert Lutz is a 35 y.o. male.   Robert Lutz presents with complaints of upper lip and nasal pain and swelling which he first noted two days ago and has increased. No drainage. Denies any previous similar. No known fevers. No headache. No known MRSA. Has been taking alkaselzer which hasn't helped. Swelling is to upper lip at frenulum into distal nasal septum, primarily to left inner nares.     ROS per HPI, negative if not otherwise mentioned.      Past Medical History:  Diagnosis Date  . Asthma   . Chronic bronchitis   . History of Helicobacter pylori infection 02/03/2020    Patient Active Problem List   Diagnosis Date Noted  . History of Helicobacter pylori infection 02/03/2020  . Asthma 02/03/2020  . Chronic bronchitis (HCC) 02/03/2020    History reviewed. No pertinent surgical history.     Home Medications    Prior to Admission medications   Medication Sig Start Date End Date Taking? Authorizing Provider  doxycycline (VIBRAMYCIN) 100 MG capsule Take 1 capsule (100 mg total) by mouth 2 (two) times daily. 08/05/20  Yes Leomar Westberg, Dorene Grebe B, NP  ibuprofen (ADVIL) 800 MG tablet Take 1 tablet (800 mg total) by mouth every 8 (eight) hours as needed for mild pain or moderate pain. 08/05/20  Yes Rachit Grim, Barron Alvine, NP  fluticasone (FLONASE) 50 MCG/ACT nasal spray Place 2 sprays into both nostrils daily. 06/03/15 12/20/19  Lyndal Pulley, MD    Family History Family History  Family history unknown: Yes    Social History Social History   Tobacco Use  . Smoking status: Current Some Day Smoker    Packs/day: 0.50    Years: 0.00    Pack years: 0.00    Types: Cigarettes  . Smokeless tobacco: Never Used  Substance Use Topics  . Alcohol use: Yes  . Drug use: Yes    Types: Marijuana      Allergies   Patient has no known allergies.   Review of Systems Review of Systems   Physical Exam Triage Vital Signs ED Triage Vitals  Enc Vitals Group     BP 08/05/20 0939 136/72     Pulse Rate 08/05/20 0939 78     Resp 08/05/20 0939 18     Temp 08/05/20 0939 98.5 F (36.9 C)     Temp Source 08/05/20 0939 Oral     SpO2 08/05/20 0939 100 %     Weight 08/05/20 0938 190 lb (86.2 kg)     Height 08/05/20 0938 5\' 9"  (1.753 m)     Head Circumference --      Peak Flow --      Pain Score 08/05/20 0938 7     Pain Loc --      Pain Edu? --      Excl. in GC? --    No data found.  Updated Vital Signs BP 136/72 (BP Location: Right Arm)   Pulse 78   Temp 98.5 F (36.9 C) (Oral)   Resp 18   Ht 5\' 9"  (1.753 m)   Wt 190 lb (86.2 kg)   SpO2 100%   BMI 28.06 kg/m   Visual Acuity Right Eye Distance:   Left Eye Distance:   Bilateral Distance:  Right Eye Near:   Left Eye Near:    Bilateral Near:     Physical Exam Constitutional:      Appearance: He is well-developed.  HENT:     Nose: Nasal tenderness present.      Comments: Firm, red, swollen to upper lip to left inner nares and septum; no active drainage, no visible pustule Cardiovascular:     Rate and Rhythm: Normal rate.  Pulmonary:     Effort: Pulmonary effort is normal.  Skin:    General: Skin is warm and dry.  Neurological:     Mental Status: He is alert and oriented to person, place, and time.      UC Treatments / Results  Labs (all labs ordered are listed, but only abnormal results are displayed) Labs Reviewed - No data to display  EKG   Radiology No results found.  Procedures Procedures (including critical care time)  Medications Ordered in UC Medications  ibuprofen (ADVIL) tablet 800 mg (has no administration in time range)    Initial Impression / Assessment and Plan / UC Course  I have reviewed the triage vital signs and the nursing notes.  Pertinent labs & imaging results that  were available during my care of the patient were reviewed by me and considered in my medical decision making (see chart for details).     Non toxi. Findings consistent with likely abscess and cellulitis at nose/ upper lip. Antibiotics provided with return precautions and ENT follow up recommendations discussed. Patient verbalized understanding and agreeable to plan.   Final Clinical Impressions(s) / UC Diagnoses   Final diagnoses:  Facial cellulitis     Discharge Instructions     Apply heat, as able, if this can drain that will help it heal.  Ibuprofen as needed for pain, take with food, don't take for another 8 hours.  Complete course of antibiotics.  Follow up with ENT if persistent, or return for any worsening of symptoms.    ED Prescriptions    Medication Sig Dispense Auth. Provider   doxycycline (VIBRAMYCIN) 100 MG capsule Take 1 capsule (100 mg total) by mouth 2 (two) times daily. 20 capsule Linus Mako B, NP   ibuprofen (ADVIL) 800 MG tablet Take 1 tablet (800 mg total) by mouth every 8 (eight) hours as needed for mild pain or moderate pain. 30 tablet Georgetta Haber, NP     PDMP not reviewed this encounter.   Georgetta Haber, NP 08/05/20 1156

## 2020-11-13 ENCOUNTER — Ambulatory Visit (HOSPITAL_COMMUNITY)
Admission: EM | Admit: 2020-11-13 | Discharge: 2020-11-13 | Disposition: A | Discharge status: Home / Self Care | Payer: Medicaid Other

## 2020-11-13 ENCOUNTER — Other Ambulatory Visit: Payer: Self-pay

## 2020-11-13 ENCOUNTER — Encounter (HOSPITAL_COMMUNITY): Payer: Self-pay

## 2020-11-13 DIAGNOSIS — G8929 Other chronic pain: Secondary | ICD-10-CM

## 2020-11-13 DIAGNOSIS — Z8619 Personal history of other infectious and parasitic diseases: Secondary | ICD-10-CM

## 2020-11-13 DIAGNOSIS — R1013 Epigastric pain: Secondary | ICD-10-CM

## 2020-11-13 NOTE — ED Provider Notes (Signed)
[facility]    CSN: [id number] Arrival date & time: 11/13/20  1301      History   Chief Complaint History of H. Pylori  HPI Robert Lutz is a 35 y.o. male.   HPI   Abdominal pain: Pt has a history of H. Pylori infection. He has had multiple positive urea breath tests and has had 2-3 round of treatment without resolution. He denies any new pain or symptoms. He reports that he has an appointment on Monday with GI but wanted to check ahead of time if he has the infection still. No bloody movements or vomiting.   Past Medical History:  Diagnosis Date  . Asthma   . Chronic bronchitis   . History of Helicobacter pylori infection 02/03/2020    Patient Active Problem List   Diagnosis Date Noted  . History of Helicobacter pylori infection 02/03/2020  . Asthma 02/03/2020  . Chronic bronchitis (HCC) 02/03/2020    History reviewed. No pertinent surgical history.     Home Medications    Prior to Admission medications   Medication Sig Start Date End Date Taking? Authorizing Provider  doxycycline (VIBRAMYCIN) 100 MG capsule Take 1 capsule (100 mg total) by mouth 2 (two) times daily. 08/05/20   Georgetta Haber, NP  ibuprofen (ADVIL) 800 MG tablet Take 1 tablet (800 mg total) by mouth every 8 (eight) hours as needed for mild pain or moderate pain. 08/05/20   Georgetta Haber, NP  fluticasone (FLONASE) 50 MCG/ACT nasal spray Place 2 sprays into both nostrils daily. 06/03/15 12/20/19  Lyndal Pulley, MD    Family History Family History  Family history unknown: Yes    Social History Social History   Tobacco Use  . Smoking status: Current Some Day Smoker    Packs/day: 0.50    Years: 0.00    Pack years: 0.00    Types: Cigarettes  . Smokeless tobacco: Never Used  Substance Use Topics  . Alcohol use: Yes  . Drug use: Yes    Types: Marijuana     Allergies   Patient has no known allergies.   Review of Systems Review of Systems  As stated above in  HPI Physical Exam Triage Vital Signs ED Triage Vitals [11/13/20 1334]  Enc Vitals Group     BP 138/79     Pulse Rate 66     Resp 16     Temp 99.3 F (37.4 C)     Temp Source Oral     SpO2 100 %     Weight 200 lb (90.7 kg)     Height      Head Circumference      Peak Flow      Pain Score 0     Pain Loc      Pain Edu?      Excl. in GC?    No data found.  Updated Vital Signs BP 138/79 (BP Location: Right Arm)   Pulse 66   Temp 99.3 F (37.4 C) (Oral)   Resp 16   Wt 200 lb (90.7 kg)   SpO2 100%   BMI 29.53 kg/m   Physical Exam Vitals and nursing note reviewed.  Constitutional:      General: He is not in acute distress.    Appearance: Normal appearance. He is not ill-appearing, toxic-appearing or diaphoretic.  Eyes:     Extraocular Movements: Extraocular movements intact.     Pupils: Pupils are equal, round, and reactive to light.  Comments: NO jaundice  Abdominal:     General: Abdomen is flat. Bowel sounds are normal. There is no distension.     Palpations: Abdomen is soft. There is no mass.     Tenderness: There is no abdominal tenderness. There is no right CVA tenderness, left CVA tenderness, guarding or rebound.     Hernia: No hernia is present.  Skin:    General: Skin is warm.     Coloration: Skin is not jaundiced or pale.  Neurological:     General: No focal deficit present.     Mental Status: He is alert and oriented to person, place, and time.      UC Treatments / Results  Labs (all labs ordered are listed, but only abnormal results are displayed) Labs Reviewed - No data to display  EKG   Radiology No results found.  Procedures Procedures (including critical care time)  Medications Ordered in UC Medications - No data to display  Initial Impression / Assessment and Plan / UC Course  I have reviewed the triage vital signs and the nursing notes.  Pertinent labs & imaging results that were available during my care of the patient were  reviewed by me and considered in my medical decision making (see chart for details).     New.  I discussed the limitations of testing in the urgent care setting as we do not have the urea breath test or stool testing capabilities.  The blood testing will likely be positive as he has already been positive in the past and I discussed this hindrance with patient.  At the moment he denies any significant symptoms and he would not like medication.  Advised that this be a no charge visit as there is nothing that I can help assist with him further other than encouraging him to keep his appointment on Monday with the specialist. Final Clinical Impressions(s) / UC Diagnoses   Final diagnoses:  History of Helicobacter pylori infection  Abdominal pain, chronic, epigastric     Discharge Instructions     Please set up an appointment with Gastroenterology for retesting and treatment given history of treatment failure.     ED Prescriptions    None     PDMP not reviewed this encounter.   Rushie Chestnut, [place] 11/13/20 1416

## 2020-11-13 NOTE — ED Triage Notes (Signed)
Patient presents to Urgent Care with complaints of a follow-up seen 6-8 months ago for h.pylori infection. He states the medications prescribed provided no relief and continues to have GI related issues.

## 2020-11-13 NOTE — Discharge Instructions (Signed)
Please set up an appointment with Gastroenterology for retesting and treatment given history of treatment failure.

## 2020-11-16 ENCOUNTER — Ambulatory Visit: Payer: Medicaid Other | Admitting: Gastroenterology

## 2020-12-02 ENCOUNTER — Other Ambulatory Visit: Payer: Self-pay

## 2020-12-02 ENCOUNTER — Encounter (HOSPITAL_COMMUNITY): Payer: Self-pay

## 2020-12-02 ENCOUNTER — Ambulatory Visit (HOSPITAL_COMMUNITY)
Admission: EM | Admit: 2020-12-02 | Discharge: 2020-12-02 | Disposition: A | Discharge status: Home / Self Care | Payer: Medicaid Other | Attending: Emergency Medicine | Admitting: Emergency Medicine

## 2020-12-02 DIAGNOSIS — H60391 Other infective otitis externa, right ear: Secondary | ICD-10-CM

## 2020-12-02 MED ORDER — CIPROFLOXACIN-DEXAMETHASONE 0.3-0.1 % OT SUSP: 4.0000 [drp] | Freq: Two times a day (BID) | OTIC | 0 refills | Status: DC

## 2020-12-02 NOTE — ED Triage Notes (Signed)
Pt in with c/o right ear pain x 3 days  Pt has been using ibuprofen for pain relief   Pt states pain is very sharp

## 2020-12-02 NOTE — Discharge Instructions (Addendum)
Instill 2 drops of antibiotic drops into your right ear twice for the next 7 days.    You can take Ibuprofen and/or Tylenol as needed for pain relief and fever reduction.     Return or go to the Emergency Department if symptoms worsen or do not improve in the next few days.

## 2020-12-02 NOTE — ED Provider Notes (Signed)
[facility]    CSN: [id number] Arrival date & time: 12/02/20  1536      History   Chief Complaint Chief Complaint  Patient presents with  . Otalgia  . Sore Throat    HPI Robert Lutz is a 35 y.o. male.   Patient here for evaluation of right ear pain that has been ongoing for the past 3 days.  Also reports having some right sided throat pain.  Has not taken any OTC medication or treatments.  Denies any trauma, injury, or other precipitating event.  Denies any specific alleviating or aggravating factors.  Denies any fevers, chest pain, shortness of breath, N/V/D, numbness, tingling, weakness, abdominal pain, or headaches.     The history is provided by the patient.    Past Medical History:  Diagnosis Date  . Asthma   . Chronic bronchitis   . History of Helicobacter pylori infection 02/03/2020    Patient Active Problem List   Diagnosis Date Noted  . History of Helicobacter pylori infection 02/03/2020  . Asthma 02/03/2020  . Chronic bronchitis (HCC) 02/03/2020    History reviewed. No pertinent surgical history.     Home Medications    Prior to Admission medications   Medication Sig Start Date End Date Taking? Authorizing Provider  ciprofloxacin-dexamethasone (CIPRODEX) OTIC suspension Place 4 drops into the right ear 2 (two) times daily. 12/02/20  Yes Ivette Loyal, NP  doxycycline (VIBRAMYCIN) 100 MG capsule Take 1 capsule (100 mg total) by mouth 2 (two) times daily. 08/05/20   Georgetta Haber, NP  ibuprofen (ADVIL) 800 MG tablet Take 1 tablet (800 mg total) by mouth every 8 (eight) hours as needed for mild pain or moderate pain. 08/05/20   Georgetta Haber, NP  fluticasone (FLONASE) 50 MCG/ACT nasal spray Place 2 sprays into both nostrils daily. 06/03/15 12/20/19  Lyndal Pulley, MD    Family History Family History  Family history unknown: Yes    Social History Social History   Tobacco Use  . Smoking status: Current Some Day Smoker     Packs/day: 0.50    Years: 0.00    Pack years: 0.00    Types: Cigarettes  . Smokeless tobacco: Never Used  Vaping Use  . Vaping Use: Never used  Substance Use Topics  . Alcohol use: Yes  . Drug use: Yes    Types: Marijuana     Allergies   Patient has no known allergies.   Review of Systems Review of Systems  HENT: Positive for ear discharge, ear pain and sore throat.   All other systems reviewed and are negative.    Physical Exam Triage Vital Signs ED Triage Vitals  Enc Vitals Group     BP 12/02/20 1627 125/72     Pulse Rate 12/02/20 1624 100     Resp 12/02/20 1624 19     Temp 12/02/20 1624 99 F (37.2 C)     Temp src --      SpO2 12/02/20 1624 99 %     Weight --      Height --      Head Circumference --      Peak Flow --      Pain Score 12/02/20 1623 8     Pain Loc --      Pain Edu? --      Excl. in GC? --    No data found.  Updated Vital Signs BP 125/72   Pulse 100   Temp 99  F (37.2 C)   Resp 19   SpO2 99%   Visual Acuity Right Eye Distance:   Left Eye Distance:   Bilateral Distance:    Right Eye Near:   Left Eye Near:    Bilateral Near:     Physical Exam Vitals and nursing note reviewed.  Constitutional:      General: He is not in acute distress.    Appearance: Normal appearance. He is not ill-appearing, toxic-appearing or diaphoretic.  HENT:     Head: Normocephalic and atraumatic.     Right Ear: Drainage, swelling and tenderness present. There is no impacted cerumen. No foreign body. No mastoid tenderness.     Left Ear: Tympanic membrane and ear canal normal. No drainage, swelling or tenderness. No foreign body. No mastoid tenderness.  Eyes:     Conjunctiva/sclera: Conjunctivae normal.  Cardiovascular:     Rate and Rhythm: Normal rate.     Pulses: Normal pulses.  Pulmonary:     Effort: Pulmonary effort is normal.  Abdominal:     General: Abdomen is flat.  Musculoskeletal:        General: Normal range of motion.     Cervical back:  Normal range of motion.  Skin:    General: Skin is warm and dry.  Neurological:     General: No focal deficit present.     Mental Status: He is alert and oriented to person, place, and time.  Psychiatric:        Mood and Affect: Mood normal.      UC Treatments / Results  Labs (all labs ordered are listed, but only abnormal results are displayed) Labs Reviewed - No data to display  EKG   Radiology No results found.  Procedures Procedures (including critical care time)  Medications Ordered in UC Medications - No data to display  Initial Impression / Assessment and Plan / UC Course  I have reviewed the triage vital signs and the nursing notes.  Pertinent labs & imaging results that were available during my care of the patient were reviewed by me and considered in my medical decision making (see chart for details).    Assessment negative for red flags or concerns.  Ciprodex 2 drops to the right ear twice a day for the next 7 days.  May take ibuprofen and/or Tylenol as needed for pain and fever.  Follow-up with primary care as needed. Final Clinical Impressions(s) / UC Diagnoses   Final diagnoses:  Other infective acute otitis externa of right ear     Discharge Instructions     Instill 2 drops of antibiotic drops into your right ear twice for the next 7 days.    You can take Ibuprofen and/or Tylenol as needed for pain relief and fever reduction.     Return or go to the Emergency Department if symptoms worsen or do not improve in the next few days.        ED Prescriptions    Medication Sig Dispense Auth. Provider   ciprofloxacin-dexamethasone (CIPRODEX) OTIC suspension Place 4 drops into the right ear 2 (two) times daily. 7.5 mL Ivette Loyal, NP     PDMP not reviewed this encounter.   Ivette Loyal, NP 12/02/20 [id number]

## 2020-12-24 ENCOUNTER — Ambulatory Visit: Payer: Medicaid Other | Admitting: Gastroenterology

## 2021-01-07 ENCOUNTER — Ambulatory Visit (HOSPITAL_COMMUNITY)
Admission: EM | Admit: 2021-01-07 | Discharge: 2021-01-07 | Disposition: A | Discharge status: Home / Self Care | Payer: Medicaid Other | Attending: Physician Assistant | Admitting: Physician Assistant

## 2021-01-07 ENCOUNTER — Other Ambulatory Visit: Payer: Self-pay

## 2021-01-07 ENCOUNTER — Encounter (HOSPITAL_COMMUNITY): Payer: Self-pay

## 2021-01-07 DIAGNOSIS — J40 Bronchitis, not specified as acute or chronic: Secondary | ICD-10-CM | POA: Insufficient documentation

## 2021-01-07 DIAGNOSIS — Z20822 Contact with and (suspected) exposure to covid-19: Secondary | ICD-10-CM | POA: Diagnosis not present

## 2021-01-07 DIAGNOSIS — R059 Cough, unspecified: Secondary | ICD-10-CM | POA: Insufficient documentation

## 2021-01-07 DIAGNOSIS — J329 Chronic sinusitis, unspecified: Secondary | ICD-10-CM | POA: Insufficient documentation

## 2021-01-07 MED ORDER — AMOXICILLIN-POT CLAVULANATE 875-125 MG PO TABS: 1.0000 | ORAL_TABLET | Freq: Two times a day (BID) | ORAL | 0 refills | Status: DC

## 2021-01-07 MED ORDER — PROMETHAZINE-DM 6.25-15 MG/5ML PO SYRP: 5.0000 mL | ORAL_SOLUTION | Freq: Every evening | ORAL | 0 refills | Status: DC | PRN

## 2021-01-07 NOTE — ED Provider Notes (Signed)
[facility]    CSN: [id number] Arrival date & time: 01/07/21  1221      History   Chief Complaint Chief Complaint  Patient presents with   c/o Sinus infection    HPI SHEMUEL Lutz is a 35 y.o. male.   Patient presents today with a 5-day history of URI symptoms that is worsened recently.  He is concerned that he might of been exposed to COVID-19 as his son recently tested positive and is requesting a COVID-19 test today.  He reports associated chills, night sweats, cough, intermittent shortness of breath, sinus pressure, congestion.  Denies any nausea, vomiting, chest pain, headaches, dizziness.  He has tried Mucinex without improvement of symptoms.  He has a history of allergies and asthma but is not taking medication for these conditions.  Denies any recent antibiotic use.  He has not had COVID-19 or influenza vaccinations.  Reports symptoms are severe and interfering with his ability to sleep through the night.  He is a former smoker.   Past Medical History:  Diagnosis Date   Asthma    Chronic bronchitis    History of Helicobacter pylori infection 02/03/2020    Patient Active Problem List   Diagnosis Date Noted   History of Helicobacter pylori infection 02/03/2020   Asthma 02/03/2020   Chronic bronchitis (HCC) 02/03/2020    History reviewed. No pertinent surgical history.     Home Medications    Prior to Admission medications   Medication Sig Start Date End Date Taking? Authorizing Provider  amoxicillin-clavulanate (AUGMENTIN) 875-125 MG tablet Take 1 tablet by mouth every 12 (twelve) hours. 01/07/21  Yes Aleasha Fregeau K, PA-C  promethazine-dextromethorphan (PROMETHAZINE-DM) 6.25-15 MG/5ML syrup Take 5 mLs by mouth at bedtime as needed for cough. 01/07/21  Yes Jolan Mealor K, PA-C  fluticasone (FLONASE) 50 MCG/ACT nasal spray Place 2 sprays into both nostrils daily. 06/03/15 12/20/19  Lyndal Pulley, MD    Family History Family History  Problem Relation Age  of Onset   Diabetes Mother    Diabetes Father     Social History Social History   Tobacco Use   Smoking status: Some Days    Packs/day: 0.50    Years: 0.00    Pack years: 0.00    Types: Cigarettes   Smokeless tobacco: Never  Vaping Use   Vaping Use: Never used  Substance Use Topics   Alcohol use: Not Currently   Drug use: Not Currently    Types: Marijuana     Allergies   Patient has no known allergies.   Review of Systems Review of Systems  Constitutional:  Negative for activity change, appetite change, fatigue and fever.  HENT:  Positive for congestion, postnasal drip and sinus pressure. Negative for sneezing and sore throat.   Respiratory:  Positive for cough and shortness of breath.   Cardiovascular:  Negative for chest pain.  Gastrointestinal:  Negative for abdominal pain, diarrhea, nausea and vomiting.  Musculoskeletal:  Negative for arthralgias and myalgias.  Neurological:  Negative for dizziness, light-headedness and headaches.    Physical Exam Triage Vital Signs ED Triage Vitals  Enc Vitals Group     BP 01/07/21 1401 116/72     Pulse Rate 01/07/21 1401 66     Resp 01/07/21 1401 20     Temp 01/07/21 1401 98.8 F (37.1 C)     Temp Source 01/07/21 1401 Oral     SpO2 01/07/21 1401 100 %     Weight --  Height --      Head Circumference --      Peak Flow --      Pain Score 01/07/21 1340 0     Pain Loc --      Pain Edu? --      Excl. in GC? --    No data found.  Updated Vital Signs BP 116/72 (BP Location: Left Arm)   Pulse 66   Temp 98.8 F (37.1 C) (Oral)   Resp 20   SpO2 100%   Visual Acuity Right Eye Distance:   Left Eye Distance:   Bilateral Distance:    Right Eye Near:   Left Eye Near:    Bilateral Near:     Physical Exam Vitals reviewed.  Constitutional:      General: He is awake.     Appearance: Normal appearance. He is normal weight. He is not ill-appearing.     Comments: Appears stated age no acute distress  HENT:      Head: Normocephalic and atraumatic.     Right Ear: Tympanic membrane, ear canal and external ear normal. Tympanic membrane is not erythematous or bulging.     Left Ear: Tympanic membrane, ear canal and external ear normal. Tympanic membrane is not erythematous or bulging.     Nose:     Right Sinus: Maxillary sinus tenderness present. No frontal sinus tenderness.     Left Sinus: Maxillary sinus tenderness present. No frontal sinus tenderness.     Mouth/Throat:     Pharynx: Uvula midline. Posterior oropharyngeal erythema present. No oropharyngeal exudate.     Comments: Moderate erythema and drainage in posterior oropharynx Cardiovascular:     Rate and Rhythm: Normal rate and regular rhythm.     Heart sounds: Normal heart sounds, S1 normal and S2 normal. No murmur heard. Pulmonary:     Effort: Pulmonary effort is normal. No accessory muscle usage or respiratory distress.     Breath sounds: Normal breath sounds. No stridor. No wheezing, rhonchi or rales.     Comments: Clear to auscultation bilaterally Abdominal:     General: Bowel sounds are normal.     Palpations: Abdomen is soft.     Tenderness: There is no abdominal tenderness.  Lymphadenopathy:     Head:     Right side of head: No submental, submandibular or tonsillar adenopathy.     Left side of head: No submental, submandibular or tonsillar adenopathy.     Cervical: No cervical adenopathy.  Neurological:     Mental Status: He is alert.  Psychiatric:        Behavior: Behavior is cooperative.     UC Treatments / Results  Labs (all labs ordered are listed, but only abnormal results are displayed) Labs Reviewed  SARS CORONAVIRUS 2 (TAT 6-24 HRS)    EKG   Radiology No results found.  Procedures Procedures (including critical care time)  Medications Ordered in UC Medications - No data to display  Initial Impression / Assessment and Plan / UC Course  I have reviewed the triage vital signs and the nursing  notes.  Pertinent labs & imaging results that were available during my care of the patient were reviewed by me and considered in my medical decision making (see chart for details).      Patient prescribed Augmentin twice daily for 7 days given prolonged and worsening symptoms.  COVID-19 test was collected as requested but discussed this would not change management given he has been symptomatic for 5 days.  He was prescribed promethazine DM for cough with instruction not to drive or drink alcohol with this medication as drowsiness is a common side effect.  Encouraged him to use over-the-counter medications for symptom management.  Discussed alarm symptoms that warrant emergent evaluation.  Strict return precautions given to which patient expressed understanding.  Final Clinical Impressions(s) / UC Diagnoses   Final diagnoses:  Sinobronchitis  Cough  Exposure to COVID-19 virus     Discharge Instructions      Take Augmentin twice daily for 7 days.  Use Promethazine DM at night for cough.  This make you sleepy so do not drive or drink alcohol with it.  Use Flonase and Mucinex for symptom relief.  We will contact you if your COVID-19 test is positive.  If you have any worsening symptoms please return for reevaluation.     ED Prescriptions     Medication Sig Dispense Auth. Provider   amoxicillin-clavulanate (AUGMENTIN) 875-125 MG tablet Take 1 tablet by mouth every 12 (twelve) hours. 14 tablet Adriene Padula K, PA-C   promethazine-dextromethorphan (PROMETHAZINE-DM) 6.25-15 MG/5ML syrup Take 5 mLs by mouth at bedtime as needed for cough. 118 mL Armin Yerger K, PA-C      PDMP not reviewed this encounter.   Jeani Hawking, PA-C 01/07/21 1417

## 2021-01-07 NOTE — ED Triage Notes (Signed)
C/o having sinus infection, yellow drainage, chills, sweating starting four days ago. No c/o pain noted. Patient stated he did take Mucinex today with little relief.

## 2021-01-07 NOTE — Discharge Instructions (Addendum)
Take Augmentin twice daily for 7 days.  Use Promethazine DM at night for cough.  This make you sleepy so do not drive or drink alcohol with it.  Use Flonase and Mucinex for symptom relief.  We will contact you if your COVID-19 test is positive.  If you have any worsening symptoms please return for reevaluation.

## 2021-01-08 LAB — SARS CORONAVIRUS 2 (TAT 6-24 HRS): SARS Coronavirus 2: POSITIVE — AB

## 2021-02-17 ENCOUNTER — Ambulatory Visit (INDEPENDENT_AMBULATORY_CARE_PROVIDER_SITE_OTHER): Payer: Medicaid Other | Admitting: Professional

## 2021-02-17 ENCOUNTER — Other Ambulatory Visit: Payer: Self-pay

## 2021-02-17 DIAGNOSIS — F331 Major depressive disorder, recurrent, moderate: Secondary | ICD-10-CM | POA: Diagnosis not present

## 2021-02-17 NOTE — Psych (Signed)
Virtual Visit via Video Note  I connected with Robert Lutz on 02/17/21 at  9:00 AM EDT by a video enabled telemedicine application and verified that I am speaking with the correct person using two identifiers.  Location: Patient: Set designer (parked) Provider: Clinical Home Office   I discussed the limitations of evaluation and management by telemedicine and the availability of in person appointments. The patient expressed understanding and agreed to proceed.  Follow Up Instructions:    I discussed the assessment and treatment plan with the patient. The patient was provided an opportunity to ask questions and all were answered. The patient agreed with the plan and demonstrated an understanding of the instructions.   The patient was advised to call back or seek an in-person evaluation if the symptoms worsen or if the condition fails to improve as anticipated.  I provided 45 minutes of non-face-to-face time during this encounter.   Quinn Axe, [facility]   Comprehensive Clinical Assessment (CCA) Note  02/17/2021 Robert Lutz [id number]  Chief Complaint:  Chief Complaint  Patient presents with   Medication Refill   Depression   Visit Diagnosis: MDD, Mod    CCA Screening, Triage and Referral (STR)  Patient Reported Information How did you hear about [place]? Other (Comment)  Referral name: [facility]  Referral phone number: No data recorded  Whom do you see for routine medical problems? Primary Care  Practice/Facility Name: [facility]  Practice/Facility Phone Number: No data recorded Name of Contact: No data recorded Contact Number: No data recorded Contact Fax Number: No data recorded Prescriber Name: No data recorded Prescriber Address (if known): No data recorded  What Is the Reason for Your Visit/Call Today? wants to get back on medication Seriquel; anger management  How Long Has This Been Causing You Problems? > than 6 months  What Do You Feel Would  Help You the Most Today? Medication(s); Treatment for Depression or other mood problem   Have You Recently Been in Any Inpatient Treatment (Hospital/Detox/Crisis Center/28-Day Program)? No  Name/Location of Program/Hospital:No data recorded How Long Were You There? No data recorded When Were You Discharged? No data recorded  Have You Ever Received Services From [facility] Before? No  Who Do You See at [facility]? No data recorded  Have You Recently Had Any Thoughts About Hurting Yourself? No  Are You Planning to Commit Suicide/Harm Yourself At This time? No   Have you Recently Had Thoughts About Hurting Someone Karolee Ohs? No  Explanation: No data recorded  Have You Used Any Alcohol or Drugs in the Past 24 Hours? No  How Long Ago Did You Use Drugs or Alcohol? No data recorded What Did You Use and How Much? No data recorded  Do You Currently Have a Therapist/Psychiatrist? No  Name of Therapist/Psychiatrist: No data recorded  Have You Been Recently Discharged From Any Office Practice or Programs? Yes  Explanation of Discharge From Practice/Program: [facility]- no shows     CCA Screening Triage Referral Assessment Type of Contact: Tele-Assessment  Is this Initial or Reassessment? Initial Assessment  Date Telepsych consult ordered in [facility]:  No data recorded Time Telepsych consult ordered in [facility]:  No data recorded  Patient Reported Information Reviewed? No data recorded Patient Left Without Being Seen? No data recorded Reason for Not Completing Assessment: No data recorded  Collateral Involvement: No data recorded  Does Patient Have a Court Appointed Legal Guardian? No data recorded Name and Contact of Legal Guardian: No data recorded If Minor  and Not Living with Parent(s), Who has Custody? No data recorded Is CPS involved or ever been involved? Never  Is APS involved or ever been involved? Never   Patient Determined To Be At Risk for Harm To Self or Others Based on  Review of Patient Reported Information or Presenting Complaint? No  Method: No data recorded Availability of Means: No data recorded Intent: No data recorded Notification Required: No data recorded Additional Information for Danger to Others Potential: No data recorded Additional Comments for Danger to Others Potential: No data recorded Are There Guns or Other Weapons in Your Home? No data recorded Types of Guns/Weapons: No data recorded Are These Weapons Safely Secured?                            No data recorded Who Could Verify You Are Able To Have These Secured: No data recorded Do You Have any Outstanding Charges, Pending Court Dates, Parole/Probation? No data recorded Contacted To Inform of Risk of Harm To Self or Others: No data recorded  [facility]   Does Patient Present under Involuntary Commitment? No  IVC Papers Initial File Date: No data recorded  [place]   Patient Currently Receiving the Following Services: Medication Management; Individual Therapy   Determination of Need: Routine (7 days)   Options For Referral: Medication Management; Outpatient Therapy     CCA Biopsychosocial Intake/Chief Complaint:  wants medication managment and anger management; "sometimes I feel depressed and I blame it on over thinking." "Sometimes I don't want to participate, leave the house, or get out of the bed. It shifts my attitude." Pt reports being off meds for 6-8 months "and I can see myself going back into my depression spells and the old person I was." Pt reports history of hearing voices "but I haven't heard voices in a while." Pt reports "I dealt a lot with that in 2021." Pt states "I really don't like to talk about the voices." Denies commands "it's just negative voices." Pt reprots trauma in past but declines to elaborate. Pt states "I want to manage myself correctly. I don't want to go to jail. I want to go back to  school. I never really thought about it being me that was the problem and now I realize it." Pt reports he has a 1yo son and is on SSI. Pt reports history of "traumatic childhood" but declines to discuss further. Pt denies drug use. Pt is hyper-focused on medications.  Current Symptoms/Problems: anger; depression; anxiety; sleep too little "my body is tired but my brain is wanting more."   Patient Reported Schizophrenia/Schizoaffective Diagnosis in Past: No   Strengths: knows what he wants  Preferences: medication management and therapy for anger management  Abilities: can attend and participate in treatment   Type of Services Patient Feels are Needed: medication and therapy   Initial Clinical Notes/Concerns: No data recorded  Mental Health Symptoms Depression:   Change in energy/activity; Hopelessness; Worthlessness; Tearfulness; Irritability; Sleep (too much or little)   Duration of Depressive symptoms:  Greater than two weeks   Mania:  No data recorded  Anxiety:    Irritability; Sleep; Worrying   Psychosis:   None   Duration of Psychotic symptoms: No data recorded  Trauma:   Emotional numbing   Obsessions:   None   Compulsions:   None   Inattention:   None   Hyperactivity/Impulsivity:   None  Oppositional/Defiant Behaviors:   None   Emotional Irregularity:   None   Other Mood/Personality Symptoms:  No data recorded   Mental Status Exam Appearance and self-care  Stature:   Average   Weight:   Average weight   Clothing:   Casual   Grooming:   Normal   Cosmetic use:   None   Posture/gait:   Normal   Motor activity:   Not Remarkable   Sensorium  Attention:   Normal   Concentration:   Normal   Orientation:   X5   Recall/memory:   Normal   Affect and Mood  Affect:   Depressed   Mood:   Depressed   Relating  Eye contact:   Fleeting   Facial expression:   Depressed   Attitude toward examiner:   Cooperative    Thought and Language  Speech flow:  Normal   Thought content:   Appropriate to Mood and Circumstances   Preoccupation:   None   Hallucinations:   None   Organization:  No data recorded  [employer] of Knowledge:   Good   Intelligence:   Average   Abstraction:   Normal   Judgement:   Fair   Reality Testing:   Adequate   Insight:   Fair   Decision Making:   Normal   Social Functioning  Social Maturity:   Responsible   Social Judgement:   Normal   Stress  Stressors:   Family conflict; Relationship; Transitions; Illness   Coping Ability:   Deficient supports   Skill Deficits:   None   Supports:   Family     Religion: Religion/Spirituality Are You A Religious Person?: Yes What is Your Religious Affiliation?: Muslim  Leisure/Recreation: Leisure / Recreation Do You Have Hobbies?: Yes Leisure and Hobbies: compose music  Exercise/Diet: Exercise/Diet Do You Exercise?: No Have You Gained or Lost A Significant Amount of Weight in the Past Six Months?: No Do You Follow a Special Diet?: No Do You Have Any Trouble Sleeping?: Yes Explanation of Sleeping Difficulties: pt reports struggling to sleep   CCA Employment/Education Employment/Work Situation: Employment / Work Situation Employment Situation: On disability Why is Patient on Disability: "I have a mental disability. I can't get stuff done." How Long has Patient Been on Disability: "since I was a young person" Patient's Job has Been Impacted by Current Illness: No Has Patient ever Been in the [employer]?: No  Education: Education Is Patient Currently Attending School?: No Did Garment/textile technologist From [employer]?: Yes (GED) Did You Have An Individualized Education Program (IIEP): No Did You Have Any Difficulty At School?: Yes Were Any Medications Ever Prescribed For These Difficulties?: No Patient's Education Has Been Impacted by Current Illness: No   CCA Family/Childhood  History Family and Relationship History: Family history Marital status: Single Are you sexually active?: Yes What is your sexual orientation?: heterosexual Does patient have children?: Yes How many children?: 1 How is patient's relationship with their children?: 1yo son  Childhood History:  Childhood History By whom was/is the patient raised?: Mother, Grandparents Additional childhood history information: "I don't like to speak on it because it was traumatic and it bothers me." Does patient have siblings?: Yes Number of Siblings: 2 Description of patient's current relationship with siblings: brother and sister Was the patient ever a victim of a crime or a disaster?: No Witnessed domestic violence?: No Has patient been affected by domestic violence as an adult?: No  Child/Adolescent Assessment:  CCA Substance Use Alcohol/Drug Use: Alcohol / Drug Use Pain Medications: pt denies Prescriptions: pt denies Over the Counter: pt denies History of alcohol / drug use?: No history of alcohol / drug abuse     ASAM's:  Six Dimensions of Multidimensional Assessment  Dimension 1:  Acute Intoxication and/or Withdrawal Potential:      Dimension 2:  Biomedical Conditions and Complications:      Dimension 3:  Emotional, Behavioral, or Cognitive Conditions and Complications:     Dimension 4:  Readiness to Change:     Dimension 5:  Relapse, Continued use, or Continued Problem Potential:     Dimension 6:  Recovery/Living Environment:     ASAM Severity Score:    ASAM Recommended Level of Treatment:     Substance use Disorder (SUD)    Recommendations for Services/Supports/Treatments: Recommendations for Services/Supports/Treatments Recommendations For Services/Supports/Treatments: Medication Management, Individual Therapy  DSM5 Diagnoses: Patient Active Problem List   Diagnosis Date Noted   Major depressive disorder, recurrent episode, moderate (HCC) 02/17/2021   History of  Helicobacter pylori infection 02/03/2020   Asthma 02/03/2020   Chronic bronchitis (HCC) 02/03/2020    Patient Centered Plan: Patient is on the following Treatment Plan(s):  Depression   Referrals to Alternative Service(s): Referred to Alternative Service(s):   Place:   Date:   Time:    Referred to Alternative Service(s):   Place:   Date:   Time:    Referred to Alternative Service(s):   Place:   Date:   Time:    Referred to Alternative Service(s):   Place:   Date:   Time:     Quinn Axe, [facility]

## 2021-02-23 ENCOUNTER — Encounter (HOSPITAL_COMMUNITY): Payer: Self-pay | Admitting: Psychiatry

## 2021-02-23 ENCOUNTER — Other Ambulatory Visit: Payer: Self-pay

## 2021-02-23 ENCOUNTER — Telehealth (INDEPENDENT_AMBULATORY_CARE_PROVIDER_SITE_OTHER): Payer: Medicaid Other | Admitting: Psychiatry

## 2021-02-23 DIAGNOSIS — F319 Bipolar disorder, unspecified: Secondary | ICD-10-CM | POA: Insufficient documentation

## 2021-02-23 MED ORDER — QUETIAPINE FUMARATE 100 MG PO TABS: 100.0000 mg | ORAL_TABLET | Freq: Every day | ORAL | 2 refills | Status: DC

## 2021-02-23 NOTE — Progress Notes (Signed)
Psychiatric Initial Adult Assessment  Virtual Visit via Video Note  I connected with Robert Lutz on 02/23/21 at  9:30 AM EDT by a video enabled telemedicine application and verified that I am speaking with the correct person using two identifiers.  Location: Patient: Home Provider: Clinic   I discussed the limitations of evaluation and management by telemedicine and the availability of in person appointments. The patient expressed understanding and agreed to proceed.  I provided 45 minutes of non-face-to-face time during this encounter.   Patient Identification: Robert Lutz MRN:  [id number] Date of Evaluation:  02/23/2021 Referral Source: [facility] Chief Complaint:  "My body is tired but my mid races"  Visit Diagnosis:    ICD-10-CM   1. Bipolar I disorder (HCC)  F31.9 QUEtiapine (SEROQUEL) 100 MG tablet    Ambulatory referral to Social Work      History of Present Illness:  35 year old male seen today for initial psychiatric evaluation. He has a psychiatric history of bipolar disorder and schizophrenia. He notes that he has been out of his medications for 5 months and would like to restart it. He reports that he has tried Seroquel.  Today he is pleasant, cooperative, engaged in conversation, and maintained eye contact. He informed Clinical research associate that recently he has been more moody, irritable, and verbally aggressive. He reports that he is tired most day but notes that his mind races and is unable to sleep more than 3 hours. He also notes that he is distractible and has AH at times. He denies VH or paranoia. He notes that his appetite is poor  Patient reports that he is also anxious and depressed most days. He notes that he worries about overcoming obstacles and being a failure. He informed Clinical research associate that he recently got his high school GED and would like to go to college to study phlebotomy or dental assistance. He reports that he has a one year old son that he desires to be better  for.  Patient notes that he does have trauma from his past  but repots that he is trying to heal and did not want to discuss it. He denies flashbacks, nightmares, or avoidant behaviors.  Today he is agreeable to restart Seroquel 100 mg to help manage mood and AH. He was referred to outpatient counseling for therapy. No other concerns noted at this time.   Associated Signs/Symptoms: Depression Symptoms:  depressed mood, anhedonia, insomnia, psychomotor agitation, fatigue, feelings of worthlessness/guilt, difficulty concentrating, hopelessness, anxiety, loss of energy/fatigue, disturbed sleep, decreased appetite, (Hypo) Manic Symptoms:  Distractibility, Elevated Mood, Flight of Ideas, Hallucinations, Impulsivity, Irritable Mood, Anxiety Symptoms:  Excessive Worry, Psychotic Symptoms:  Hallucinations: Auditory PTSD Symptoms: Had a traumatic exposure:  Notes has trauma but does not want to discuss  Past Psychiatric History: Bipolar disorder, schizophrenia  Previous Psychotropic Medications:  Seroquel   Substance Abuse History in the last 12 months:  No.  Consequences of Substance Abuse: NA  Past Medical History:  Past Medical History:  Diagnosis Date   Asthma    Chronic bronchitis    History of Helicobacter pylori infection 02/03/2020   No past surgical history on file.  Family Psychiatric History: Notes that paternal and maternal family members have undiagnosed mental health conditions  Family History:  Family History  Problem Relation Age of Onset   Diabetes Mother    Diabetes Father     Social History:   Social History   Socioeconomic History   Marital status: Married  Spouse name: Not on file   Number of children: Not on file   Years of education: Not on file   Highest education level: Not on file  Occupational History   Not on file  Tobacco Use   Smoking status: Some Days    Packs/day: 0.50    Years: 0.00    Pack years: 0.00    Types:  Cigarettes   Smokeless tobacco: Never  Vaping Use   Vaping Use: Never used  Substance and Sexual Activity   Alcohol use: Not Currently   Drug use: Not Currently    Types: Marijuana   Sexual activity: Not on file  Other Topics Concern   Not on file  Social History Narrative   Not on file   Social Determinants of Health   Financial Resource Strain: Not on file  Food Insecurity: Not on file  Transportation Needs: Not on file  Physical Activity: Not on file  Stress: Not on file  Social Connections: Not on file    Additional Social History: Patient resides in [place]. He is in a relationship an has two children. He is on SSI. He notes that he smokes 5 ciggerattes a day. He denies alcohol or illegal drug use.   Allergies:  No Known Allergies  Metabolic Disorder Labs: No results found for: HGBA1C, MPG No results found for: PROLACTIN No results found for: CHOL, TRIG, HDL, CHOLHDL, VLDL, LDLCALC No results found for: TSH  Therapeutic Level Labs: No results found for: LITHIUM No results found for: CBMZ No results found for: VALPROATE  Current Medications: Current Outpatient Medications  Medication Sig Dispense Refill   QUEtiapine (SEROQUEL) 100 MG tablet Take 1 tablet (100 mg total) by mouth at bedtime. 30 tablet 2   amoxicillin-clavulanate (AUGMENTIN) 875-125 MG tablet Take 1 tablet by mouth every 12 (twelve) hours. 14 tablet 0   promethazine-dextromethorphan (PROMETHAZINE-DM) 6.25-15 MG/5ML syrup Take 5 mLs by mouth at bedtime as needed for cough. 118 mL 0   No current facility-administered medications for this visit.    Musculoskeletal: Strength & Muscle Tone:  Unable to assess due to telehealth visit Gait & Station:  Unable to assess due to telehealth visit Patient leans: N/A  Psychiatric Specialty Exam: Review of Systems  There were no vitals taken for this visit.There is no height or weight on file to calculate BMI.  General Appearance: Well Groomed  Eye  Contact:  Good  Speech:  Clear and Coherent and Normal Rate  Volume:  Normal  Mood:  Anxious and Depressed  Affect:  Appropriate and Congruent  Thought Process:  Coherent, Goal Directed, and Linear  Orientation:  Full (Time, Place, and Person)  Thought Content:  Logical and Hallucinations: Auditory  Suicidal Thoughts:  No  Homicidal Thoughts:  No  Memory:  Immediate;   Good Recent;   Good Remote;   Good  Judgement:  Fair  Insight:  Fair  Psychomotor Activity:  Normal  Concentration:  Concentration: Good and Attention Span: Good  Recall:  Good  Fund of Knowledge:Good  Language: Good  Akathisia:  No  Handed:  Right  AIMS (if indicated):  not done  Assets:  Communication Skills Desire for Improvement Housing Intimacy Leisure Time Physical Health Social Support  ADL's:  Intact  Cognition: WNL  Sleep:  Poor   Screenings: GAD-7    Flowsheet Row Video Visit from 02/23/2021 in [facility]  Total GAD-7 Score 19      PHQ2-9    Flowsheet Row Video  Visit [facility] 02/23/2021 in [facility] from 02/17/2021 in [facility]  PHQ-2 Total Score 6 6  PHQ-9 Total Score 24 24      Flowsheet Row Video Visit from 02/23/2021 in [facility] Counselor from 02/17/2021 in [facility] ED from 01/07/2021 in [facility] RISK CATEGORY No Risk No Risk No Risk       Assessment and Plan: Patient endorses symptoms of anxiety, depression, and hypomania. Today he is agreeable to restart Seroquel 100 mg to help manage mood and AH. He was referred to outpatient counseling for therapy.  1. Bipolar I disorder (HCC)  Restart - QUEtiapine (SEROQUEL) 100 MG tablet; Take 1 tablet (100 mg total) by mouth at bedtime.  Dispense: 30 tablet; Refill: 2 - Ambulatory referral to Social Work    Shanna Cisco, NP 8/9/202210:17  AM

## 2021-04-16 ENCOUNTER — Other Ambulatory Visit: Payer: Self-pay

## 2021-04-16 ENCOUNTER — Ambulatory Visit (HOSPITAL_COMMUNITY): Payer: Medicaid Other | Admitting: Clinical
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
## 2021-05-26 ENCOUNTER — Encounter (HOSPITAL_COMMUNITY): Payer: Self-pay | Admitting: Psychiatry

## 2021-05-26 ENCOUNTER — Ambulatory Visit (INDEPENDENT_AMBULATORY_CARE_PROVIDER_SITE_OTHER): Payer: Medicaid Other | Admitting: Psychiatry

## 2021-05-26 DIAGNOSIS — F319 Bipolar disorder, unspecified: Secondary | ICD-10-CM | POA: Diagnosis not present

## 2021-05-26 MED ORDER — QUETIAPINE FUMARATE 100 MG PO TABS: 100.0000 mg | ORAL_TABLET | Freq: Every day | ORAL | 3 refills | Status: DC

## 2021-05-26 NOTE — Progress Notes (Signed)
BH MD/PA/NP OP Progress Note  05/26/2021 10:38 AM Robert Lutz  MRN:  [id number]  Chief Complaint: "I ran out of medications but things are working out" Chief Complaint   Medication Management    HPI:35 year old male seen today for follow up psychiatric evaluation. He has a psychiatric history of bipolar disorder and schizophrenia. He is currently managed on Seroquel 100 mg nightly/ He notes his medications are effective in managing his psychiatric conditions.     Today he is pleasant, cooperative, engaged in conversation, and maintained eye contact. He informed Clinical research associate that recently he ran out of his medications however notes that things has been working out.  He notes that he ran out about 1-1/2 weeks ago and reports that he has been more anxious and irritable since running out.  He informed Clinical research associate that he worries about his 70-year-old son (noting that he and his girlfriend chooses to keep him out of daycare due to fear of germs), his girlfriend who is currently pregnant, and his future plans.  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  He informed Clinical research associate that he is proud of himself because he obtained his high school diploma.  Provider conducted a GAD-7 and patient scored a 16.  Provider also conducted PHQ-9 and patient scored a 9.  He endorses adequate appetite.  He notes that since being off of Seroquel his sleep has been poor noting that he slept only 1 hour last night.  Today he denies SI/HI/VAH, mania, paranoia.    Today patient is agreeable to restarting Seroquel 100 mg to help manage mood.  He will follow-up with outpatient counseling for therapy.  No other concerns at this time    Visit Diagnosis:    ICD-10-CM   1. Bipolar I disorder (HCC)  F31.9 QUEtiapine (SEROQUEL) 100 MG tablet      Past Psychiatric History: bipolar disorder and schizophrenia  Past Medical History:  Past Medical History:  Diagnosis Date   Asthma    Chronic  bronchitis    History of Helicobacter pylori infection 02/03/2020   History reviewed. No pertinent surgical history.  Family Psychiatric History: Notes that paternal and maternal family members have undiagnosed mental health conditions  Family History:  Family History  Problem Relation Age of Onset   Diabetes Mother    Diabetes Father     Social History:  Social History   Socioeconomic History   Marital status: Married    Spouse name: Not on file   Number of children: Not on file   Years of education: Not on file   Highest education level: Not on file  Occupational History   Not on file  Tobacco Use   Smoking status: Some Days    Packs/day: 0.50    Years: 0.00    Pack years: 0.00    Types: Cigarettes   Smokeless tobacco: Never  Vaping Use   Vaping Use: Never used  Substance and Sexual Activity   Alcohol use: Not Currently   Drug use: Not Currently    Types: Marijuana   Sexual activity: Not on file  Other Topics Concern   Not on file  Social History Narrative   Not on file   Social Determinants of Health   Financial Resource Strain: Not on file  Food Insecurity: Not on file  Transportation Needs: Not on file  Physical Activity: Not on file  Stress: Not on file  Social Connections: Not on file  Allergies: No Known Allergies  Metabolic Disorder Labs: No results found for: HGBA1C, MPG No results found for: PROLACTIN No results found for: CHOL, TRIG, HDL, CHOLHDL, VLDL, LDLCALC No results found for: TSH  Therapeutic Level Labs: No results found for: LITHIUM No results found for: VALPROATE No components found for:  CBMZ  Current Medications: Current Outpatient Medications  Medication Sig Dispense Refill   amoxicillin-clavulanate (AUGMENTIN) 875-125 MG tablet Take 1 tablet by mouth every 12 (twelve) hours. 14 tablet 0   promethazine-dextromethorphan (PROMETHAZINE-DM) 6.25-15 MG/5ML syrup Take 5 mLs by mouth at bedtime as needed for cough. 118 mL 0    QUEtiapine (SEROQUEL) 100 MG tablet Take 1 tablet (100 mg total) by mouth at bedtime. 30 tablet 3   No current facility-administered medications for this visit.     Musculoskeletal: Strength & Muscle Tone: within normal limits Gait & Station: normal Patient leans: N/A  Psychiatric Specialty Exam: Review of Systems  Blood pressure 124/79, pulse 84, height 5\' 9"  (1.753 m), weight 207 lb (93.9 kg), SpO2 100 %.Body mass index is 30.57 kg/m.  General Appearance: Well Groomed  Eye Contact:  Good  Speech:  Clear and Coherent and Normal Rate  Volume:  Normal  Mood:  Euthymic, notes that has some anxiety but is able to cope with it once on Seroquel  Affect:  Appropriate and Congruent  Thought Process:  Coherent, Goal Directed, and Linear  Orientation:  Full (Time, Place, and Person)  Thought Content: WDL and Logical   Suicidal Thoughts:  No  Homicidal Thoughts:  No  Memory:  Immediate;   Good Recent;   Good Remote;   Good  Judgement:  Good  Insight:  Good  Psychomotor Activity:  Normal  Concentration:  Concentration: Good and Attention Span: Good  Recall:  Good  Fund of Knowledge: Good  Language: Good  Akathisia:  No  Handed:  Right  AIMS (if indicated): not done  Assets:  Communication Skills Desire for Improvement Financial Resources/Insurance Housing Intimacy Leisure Time Physical Health Social Support  ADL's:  Intact  Cognition: WNL  Sleep:  Poor   Screenings: GAD-7    Flowsheet Row Clinical Support from 05/26/2021 in [facility] Video Visit from 02/23/2021 in [facility]  Total GAD-7 Score 16 19      PHQ2-9    Flowsheet Row Clinical Support from 05/26/2021 in [facility] Video Visit from 02/23/2021 in [facility] Counselor from 02/17/2021 in [facility]  PHQ-2 Total Score 2 6 6   PHQ-9 Total Score 9 24 24       Flowsheet  Row Video Visit from 02/23/2021 in [facility] Counselor from 02/17/2021 in [facility] ED from 01/07/2021 in [facility] RISK CATEGORY No Risk No Risk No Risk        Assessment and Plan: Patient notes that he has been more anxious since being off of his Seroquel for the last week and a half.  Today he is agreeable to restarting Seroquel 100 mg to help manage anxiety and mood.  1. Bipolar I disorder (HCC)  Restart- QUEtiapine (SEROQUEL) 100 MG tablet; Take 1 tablet (100 mg total) by mouth at bedtime.  Dispense: 30 tablet; Refill: 3   Follow-up in 3 Follow-up with therapy 01/09/2021, NP 05/26/2021, 10:38 AM

## 2021-06-20 ENCOUNTER — Encounter (HOSPITAL_COMMUNITY): Payer: Self-pay | Admitting: Emergency Medicine

## 2021-06-20 ENCOUNTER — Emergency Department (HOSPITAL_COMMUNITY)
Admission: EM | Admit: 2021-06-20 | Discharge: 2021-06-20 | Disposition: A | Discharge status: Home / Self Care | Payer: Medicaid Other | Attending: Emergency Medicine | Admitting: Emergency Medicine

## 2021-06-20 ENCOUNTER — Other Ambulatory Visit: Payer: Self-pay

## 2021-06-20 DIAGNOSIS — Z20822 Contact with and (suspected) exposure to covid-19: Secondary | ICD-10-CM | POA: Insufficient documentation

## 2021-06-20 DIAGNOSIS — J45909 Unspecified asthma, uncomplicated: Secondary | ICD-10-CM | POA: Insufficient documentation

## 2021-06-20 DIAGNOSIS — F17210 Nicotine dependence, cigarettes, uncomplicated: Secondary | ICD-10-CM | POA: Insufficient documentation

## 2021-06-20 DIAGNOSIS — R059 Cough, unspecified: Secondary | ICD-10-CM | POA: Diagnosis present

## 2021-06-20 DIAGNOSIS — J069 Acute upper respiratory infection, unspecified: Secondary | ICD-10-CM | POA: Diagnosis not present

## 2021-06-20 DIAGNOSIS — Z7951 Long term (current) use of inhaled steroids: Secondary | ICD-10-CM | POA: Insufficient documentation

## 2021-06-20 LAB — RESP PANEL BY RT-PCR (FLU A&B, COVID) ARPGX2
Influenza A by PCR: NEGATIVE
Influenza B by PCR: NEGATIVE
SARS Coronavirus 2 by RT PCR: NEGATIVE

## 2021-06-20 MED ORDER — DOXYCYCLINE HYCLATE 100 MG PO CAPS: 100.0000 mg | ORAL_CAPSULE | Freq: Two times a day (BID) | ORAL | 0 refills | Status: DC

## 2021-06-20 NOTE — ED Provider Notes (Signed)
[facility] EMERGENCY DEPARTMENT Provider Note   CSN: [id number] Arrival date & time: 06/20/21  2595     History No chief complaint on file.   Robert Lutz is a 35 y.o. male.  HPI 53 male presents today with 1 week history of nasal congestion with discolored sputum.  He has been having some coughing and felt generally weak.  Is been taking Mucinex with some relief.  Does not have a definitive fever but has felt that he has had some chills.  He has no known sick exposures.  He quit smoking 2 weeks ago.  He denies dyspnea or wheezing.  He feels that this is similar to previous episodes of sinusitis which improved with antibiotics.  He has not had a flu shot this year.     Past Medical History:  Diagnosis Date   Asthma    Chronic bronchitis    History of Helicobacter pylori infection 02/03/2020    Patient Active Problem List   Diagnosis Date Noted   Bipolar I disorder (HCC) 02/23/2021   Major depressive disorder, recurrent episode, moderate (HCC) 02/17/2021   History of Helicobacter pylori infection 02/03/2020   Asthma 02/03/2020   Chronic bronchitis (HCC) 02/03/2020    History reviewed. No pertinent surgical history.     Family History  Problem Relation Age of Onset   Diabetes Mother    Diabetes Father     Social History   Tobacco Use   Smoking status: Some Days    Packs/day: 0.50    Years: 0.00    Pack years: 0.00    Types: Cigarettes   Smokeless tobacco: Never  Vaping Use   Vaping Use: Never used  Substance Use Topics   Alcohol use: Not Currently   Drug use: Not Currently    Types: Marijuana    Home Medications Prior to Admission medications   Medication Sig Start Date End Date Taking? Authorizing Provider  doxycycline (VIBRAMYCIN) 100 MG capsule Take 1 capsule (100 mg total) by mouth 2 (two) times daily. 06/20/21  Yes [name], MD  amoxicillin-clavulanate (AUGMENTIN) 875-125 MG tablet Take 1 tablet by mouth every 12 (twelve)  hours. 01/07/21   Raspet, Noberto Retort, PA-C  promethazine-dextromethorphan (PROMETHAZINE-DM) 6.25-15 MG/5ML syrup Take 5 mLs by mouth at bedtime as needed for cough. 01/07/21   Raspet, Noberto Retort, PA-C  QUEtiapine (SEROQUEL) 100 MG tablet Take 1 tablet (100 mg total) by mouth at bedtime. 05/26/21   Shanna Cisco, NP  fluticasone (FLONASE) 50 MCG/ACT nasal spray Place 2 sprays into both nostrils daily. 06/03/15 12/20/19  Lyndal Pulley, MD    Allergies    Patient has no known allergies.  Review of Systems   Review of Systems  All other systems reviewed and are negative.  Physical Exam Updated Vital Signs BP 136/87 (BP Location: Left Arm)   Pulse 89   Temp 98 F (36.7 C) (Oral)   Resp 20   SpO2 99%   Physical Exam Vitals and nursing note reviewed.  Constitutional:      Appearance: He is well-developed.  HENT:     Head: Normocephalic and atraumatic.     Right Ear: Tympanic membrane and external ear normal.     Left Ear: Tympanic membrane and external ear normal.     Nose: Nose normal.     Mouth/Throat:     Mouth: Mucous membranes are moist.     Pharynx: Oropharynx is clear. No oropharyngeal exudate or posterior oropharyngeal erythema.  Eyes:  Extraocular Movements: Extraocular movements intact.  Neck:     Trachea: No tracheal deviation.  Cardiovascular:     Rate and Rhythm: Normal rate and regular rhythm.  Pulmonary:     Effort: Pulmonary effort is normal.     Breath sounds: Normal breath sounds.  Abdominal:     Palpations: Abdomen is soft.  Musculoskeletal:        General: Normal range of motion.     Cervical back: Normal range of motion and neck supple.  Skin:    General: Skin is warm and dry.  Neurological:     Mental Status: He is alert and oriented to person, place, and time.  Psychiatric:        Mood and Affect: Mood normal.        Behavior: Behavior normal.    ED Results / Procedures / Treatments   Labs (all labs ordered are listed, but only abnormal results  are displayed) Labs Reviewed  RESP PANEL BY RT-PCR (FLU A&B, COVID) [id number]    EKG None  Radiology No results found.  Procedures Procedures   Medications Ordered in ED Medications - No data to display  ED Course  I have reviewed the triage vital signs and the nursing notes.  Pertinent labs & imaging results that were available during my care of the patient were reviewed by me and considered in my medical decision making (see chart for details).    MDM Rules/Calculators/A&P                         Patient with URI symptoms and thick discolored nasal discharge.  He has history of sinusitis.  Flu and COVID swabs obtained prior to my evaluation and are pending.  Plan is to give patient prescription for antibiotics, we have discussed that if his COVID and flu are positive that he does not need to take the antibiotics.  If these are negative, he will always proceed to get his antibiotics filled.  We discussed return precautions and need for follow-up and he voiced understanding.  Final Clinical Impression(s) / ED Diagnoses Final diagnoses:  Upper respiratory tract infection, unspecified type    Rx / DC Orders ED Discharge Orders          Ordered    doxycycline (VIBRAMYCIN) 100 MG capsule  2 times daily        06/20/21 1007             [name], MD 06/20/21 1011

## 2021-06-20 NOTE — Discharge Instructions (Addendum)
Please follow-up with your COVID and flu test in MyChart.  If this is positive for COVID or flu, you should quarantine until you are feeling better and have been having improving symptoms over 24 hours.  For COVID it is a total of 6 days from the onset, for flu it is when you have not had a fever for 24 hours. If these are negative, you can fill your prescription for antibiotics and take this.

## 2021-06-20 NOTE — ED Provider Notes (Signed)
Emergency Medicine Provider Triage Evaluation Note  Robert Lutz , a 35 y.o. male  was evaluated in triage.  Pt complains of thick yellow nasal drainage onset 1 week.  He has associated productive cough with brown phlegm, generalized weakness, subjective fever.  He tried Mucinex and tea with some relief of his symptoms.  Patient denies nasal congestion, chest pain, shortness of breath, chills, abdominal pain, nausea, vomiting.  Denies sick contacts.  Review of Systems  Positive: Rhinorrhea, productive cough Negative: Chest pain, shortness of breath  Physical Exam  BP 136/87 (BP Location: Left Arm)   Pulse 89   Temp 98 F (36.7 C) (Oral)   Resp 20   SpO2 99%  Gen:   Awake, no distress   Resp:  Normal effort  MSK:   Moves extremities without difficulty  Other:  Clear rhinorrhea noted  Medical Decision Making  Medically screening exam initiated at 9:35 AM.  Appropriate orders placed.  Robert Lutz was informed that the remainder of the evaluation will be completed by another provider, this initial triage assessment does not replace that evaluation, and the importance of remaining in the ED until their evaluation is complete.    Tishia Maestre A, PA-C 06/20/21 1025    [name], MD 06/22/21 [phone]

## 2021-06-20 NOTE — ED Triage Notes (Signed)
C/o thick yellow nasal drainage, coughing up brown phlegm, generalized weakness, and feeling hot x 1 week.  Taking muccinex with some relief.

## 2021-08-18 ENCOUNTER — Telehealth (INDEPENDENT_AMBULATORY_CARE_PROVIDER_SITE_OTHER): Payer: Medicaid Other | Admitting: Psychiatry

## 2021-08-18 ENCOUNTER — Encounter (HOSPITAL_COMMUNITY): Payer: Self-pay | Admitting: Psychiatry

## 2021-08-18 DIAGNOSIS — F319 Bipolar disorder, unspecified: Secondary | ICD-10-CM | POA: Diagnosis not present

## 2021-08-18 MED ORDER — QUETIAPINE FUMARATE 100 MG PO TABS: 100.0000 mg | ORAL_TABLET | Freq: Every day | ORAL | 3 refills | Status: DC

## 2021-08-18 NOTE — Progress Notes (Signed)
BH MD/PA/NP OP Progress Note Virtual Visit via Telephone Note  I connected with Robert Lutz A Salls on 08/18/21 at  9:00 AM EST by telephone and verified that I am speaking with the correct person using two identifiers.  Location: Patient: home Provider: Clinic   I discussed the limitations, risks, security and privacy concerns of performing an evaluation and management service by telephone and the availability of in person appointments. I also discussed with the patient that there may be a patient responsible charge related to this service. The patient expressed understanding and agreed to proceed.   I provided 30 minutes of non-face-to-face time during this encounter.  08/18/2021 9:26 AM Robert Lutz A Mclaine  MRN:  [id number]  Chief Complaint: "I doing so much better"   HPI:36 year old male seen today for follow up psychiatric evaluation. He has a psychiatric history of bipolar disorder and schizophrenia. He is currently managed on Seroquel 100 mg nightly. He notes his medications are effective in managing his psychiatric conditions.     Today he was unable to login virtually so once that was done over the phone.  He informed Clinical research associatewriter that since being back on Seroquel he has been doing very good.  He notes that he is excited about the birth of his daughter in March.  He also notes that his 37-year-old son and his girlfriend are doing well.  Patient notes that his mood is stable and reports that he has minimal anxiety and depression.  Provider conducted GAD-7 and patient scored a 2, at his last visit he scored a 16.  Provider also conducted PHQ-9 patient scored 2, at his last visit he scored a 9.  He notes that he is now sleeping over 8 hours nightly instead of 1 hour nightly.  He endorses having an increased appetite and reports that since his last visit he has gained 20 pounds.  Today he denies SI/HI/AVH, mania, paranoia.    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  He does note that  he still plans to go to [employer] to study dentistry and will consider registering in the fall.  He notes that his girlfriend also is in school and he wants to be able to support her after her delivery prior to him going back to school.    Overall patient notes that he is doing well no medication changes made today.  Patient agreeable to continue medication as prescribed.    Visit Diagnosis:    ICD-10-CM   1. Bipolar I disorder (HCC)  F31.9 QUEtiapine (SEROQUEL) 100 MG tablet      Past Psychiatric History: bipolar disorder and schizophrenia  Past Medical History:  Past Medical History:  Diagnosis Date   Asthma    Chronic bronchitis    History of Helicobacter pylori infection 02/03/2020   History reviewed. No pertinent surgical history.  Family Psychiatric History: Notes that paternal and maternal family members have undiagnosed mental health conditions  Family History:  Family History  Problem Relation Age of Onset   Diabetes Mother    Diabetes Father     Social History:  Social History   Socioeconomic History   Marital status: Married    Spouse name: Not on file   Number of children: Not on file   Years of education: Not on file   Highest education level: Not on file  Occupational History   Not on file  Tobacco Use   Smoking status: Some Days    Packs/day: 0.50  Years: 0.00    Pack years: 0.00    Types: Cigarettes   Smokeless tobacco: Never  Vaping Use   Vaping Use: Never used  Substance and Sexual Activity   Alcohol use: Not Currently   Drug use: Not Currently    Types: Marijuana   Sexual activity: Not on file  Other Topics Concern   Not on file  Social History Narrative   Not on file   Social Determinants of Health   Financial Resource Strain: Not on file  Food Insecurity: Not on file  Transportation Needs: Not on file  Physical Activity: Not on file  Stress: Not on file  Social Connections: Not on file    Allergies: No Known Allergies  Metabolic  Disorder Labs: No results found for: HGBA1C, MPG No results found for: PROLACTIN No results found for: CHOL, TRIG, HDL, CHOLHDL, VLDL, LDLCALC No results found for: TSH  Therapeutic Level Labs: No results found for: LITHIUM No results found for: VALPROATE No components found for:  CBMZ  Current Medications: Current Outpatient Medications  Medication Sig Dispense Refill   amoxicillin-clavulanate (AUGMENTIN) 875-125 MG tablet Take 1 tablet by mouth every 12 (twelve) hours. 14 tablet 0   doxycycline (VIBRAMYCIN) 100 MG capsule Take 1 capsule (100 mg total) by mouth 2 (two) times daily. 20 capsule 0   promethazine-dextromethorphan (PROMETHAZINE-DM) 6.25-15 MG/5ML syrup Take 5 mLs by mouth at bedtime as needed for cough. 118 mL 0   QUEtiapine (SEROQUEL) 100 MG tablet Take 1 tablet (100 mg total) by mouth at bedtime. 30 tablet 3   No current facility-administered medications for this visit.     Musculoskeletal: Strength & Muscle Tone:  Unable to assess due to telephone visit Gait & Station:   Unable to assess due to telephone visit Patient leans: N/A  Psychiatric Specialty Exam: Review of Systems  There were no vitals taken for this visit.There is no height or weight on file to calculate BMI.  General Appearance:   Unable to assess due to telephone visit  Eye Contact:    Unable to assess due to telephone visit  Speech:  Clear and Coherent and Normal Rate  Volume:  Normal  Mood:  Euthymic,   Affect:  Appropriate and Congruent  Thought Process:  Coherent, Goal Directed, and Linear  Orientation:  Full (Time, Place, and Person)  Thought Content: WDL and Logical   Suicidal Thoughts:  No  Homicidal Thoughts:  No  Memory:  Immediate;   Good Recent;   Good Remote;   Good  Judgement:  Good  Insight:  Good  Psychomotor Activity:    Unable to assess due to telephone visit  Concentration:  Concentration: Good and Attention Span: Good  Recall:  Good  Fund of Knowledge: Good  Language:  Good  Akathisia:    Unable to assess due to telephone visit  Handed:  Right  AIMS (if indicated): not done  Assets:  Communication Skills Desire for Improvement Financial Resources/Insurance Housing Intimacy Leisure Time Physical Health Social Support  ADL's:  Intact  Cognition: WNL  Sleep:  Good   Screenings: GAD-7    Flowsheet Row Video Visit from 08/18/2021 in [facility] Clinical Support from 05/26/2021 in [facility] Video Visit from 02/23/2021 in [facility]  Total GAD-7 Score 2 16 19       PHQ2-9    Flowsheet Row Video Visit from 08/18/2021 in [facility] Clinical Support from 05/26/2021 in [facility] Video Visit from 02/23/2021 in [facility] Counselor from 02/17/2021 in [place]  PHQ-2 Total Score 0 2 6 6   PHQ-9 Total Score 2 9 24 24       Flowsheet Row ED from 06/20/2021 in [facility] EMERGENCY DEPARTMENT Video Visit from 02/23/2021 in [facility] Counselor from 02/17/2021 in [facility]  C-SSRS RISK CATEGORY No Risk No Risk No Risk        Assessment and Plan: Patient notes that he is doing well on his current medication regimen.  No medication changes made today.  Patient agreeable to continue medications as prescribed.    1. Bipolar I disorder (HCC)  Restart- QUEtiapine (SEROQUEL) 100 MG tablet; Take 1 tablet (100 mg total) by mouth at bedtime.  Dispense: 30 tablet; Refill: 3   Follow-up in 3 Follow-up with therapy 04/19/2021, NP 08/18/2021, 9:26 AM

## 2021-09-29 ENCOUNTER — Other Ambulatory Visit: Payer: Self-pay

## 2021-09-29 ENCOUNTER — Emergency Department (HOSPITAL_COMMUNITY)
Admission: EM | Admit: 2021-09-29 | Discharge: 2021-09-29 | Discharge status: Against Medical Advice | Payer: Medicaid Other | Attending: Emergency Medicine | Admitting: Emergency Medicine

## 2021-09-29 ENCOUNTER — Encounter (HOSPITAL_COMMUNITY): Payer: Self-pay

## 2021-09-29 DIAGNOSIS — Z20822 Contact with and (suspected) exposure to covid-19: Secondary | ICD-10-CM | POA: Diagnosis not present

## 2021-09-29 DIAGNOSIS — R112 Nausea with vomiting, unspecified: Secondary | ICD-10-CM | POA: Insufficient documentation

## 2021-09-29 DIAGNOSIS — R509 Fever, unspecified: Secondary | ICD-10-CM | POA: Insufficient documentation

## 2021-09-29 DIAGNOSIS — Z5321 Procedure and treatment not carried out due to patient leaving prior to being seen by health care provider: Secondary | ICD-10-CM | POA: Insufficient documentation

## 2021-09-29 DIAGNOSIS — R109 Unspecified abdominal pain: Secondary | ICD-10-CM | POA: Diagnosis present

## 2021-09-29 LAB — CBC WITH DIFFERENTIAL/PLATELET
Abs Immature Granulocytes: 0.03 10*3/uL (ref 0.00–0.07)
Basophils Absolute: 0 10*3/uL (ref 0.0–0.1)
Basophils Relative: 0 %
Eosinophils Absolute: 0 10*3/uL (ref 0.0–0.5)
Eosinophils Relative: 0 %
HCT: 41.2 % (ref 39.0–52.0)
Hemoglobin: 13.7 g/dL (ref 13.0–17.0)
Immature Granulocytes: 0 %
Lymphocytes Relative: 9 %
Lymphs Abs: 0.8 10*3/uL (ref 0.7–4.0)
MCH: 30.4 pg (ref 26.0–34.0)
MCHC: 33.3 g/dL (ref 30.0–36.0)
MCV: 91.4 fL (ref 80.0–100.0)
Monocytes Absolute: 0.7 10*3/uL (ref 0.1–1.0)
Monocytes Relative: 8 %
Neutro Abs: 6.9 10*3/uL (ref 1.7–7.7)
Neutrophils Relative %: 83 %
Platelets: 306 10*3/uL (ref 150–400)
RBC: 4.51 MIL/uL (ref 4.22–5.81)
RDW: 13.3 % (ref 11.5–15.5)
WBC: 8.3 10*3/uL (ref 4.0–10.5)
nRBC: 0 % (ref 0.0–0.2)

## 2021-09-29 LAB — COMPREHENSIVE METABOLIC PANEL
ALT: 44 U/L (ref 0–44)
AST: 25 U/L (ref 15–41)
Albumin: 4.4 g/dL (ref 3.5–5.0)
Alkaline Phosphatase: 53 U/L (ref 38–126)
Anion gap: 10 (ref 5–15)
BUN: 12 mg/dL (ref 6–20)
CO2: 25 mmol/L (ref 22–32)
Calcium: 9.3 mg/dL (ref 8.9–10.3)
Chloride: 103 mmol/L (ref 98–111)
Creatinine, Ser: 1.1 mg/dL (ref 0.61–1.24)
GFR, Estimated: >60 mL/min (ref >60)
Glucose, Bld: 102 mg/dL — ABNORMAL HIGH (ref 70–99)
Potassium: 4 mmol/L (ref 3.5–5.1)
Sodium: 138 mmol/L (ref 135–145)
Total Bilirubin: 1.3 mg/dL — ABNORMAL HIGH (ref 0.3–1.2)
Total Protein: 7.8 g/dL (ref 6.5–8.1)

## 2021-09-29 LAB — LIPASE, BLOOD: Lipase: 29 U/L (ref 11–51)

## 2021-09-29 LAB — RESP PANEL BY RT-PCR (FLU A&B, COVID) ARPGX2
Influenza A by PCR: NEGATIVE
Influenza B by PCR: NEGATIVE
SARS Coronavirus 2 by RT PCR: NEGATIVE

## 2021-09-29 LAB — URINALYSIS, ROUTINE W REFLEX MICROSCOPIC
Bilirubin Urine: NEGATIVE
Glucose, UA: NEGATIVE mg/dL
Hgb urine dipstick: NEGATIVE
Ketones, ur: NEGATIVE mg/dL
Leukocytes,Ua: NEGATIVE
Nitrite: NEGATIVE
Protein, ur: NEGATIVE mg/dL
Specific Gravity, Urine: 1.024 (ref 1.005–1.030)
pH: 8 (ref 5.0–8.0)

## 2021-09-29 NOTE — ED Notes (Signed)
Pt to the desk, states that he needs to go pick his child up and is unable to stay.  ?

## 2021-09-29 NOTE — ED Triage Notes (Signed)
Last night, had vomiting, feeling weak today, running a fever as well. ?

## 2021-09-29 NOTE — ED Provider Triage Note (Signed)
Emergency Medicine Provider Triage Evaluation Note ? ?Robert Lutz , a 36 y.o. male  was evaluated in triage.  Pt complains of vomit. Request covid test, however primary complaint is persistent nausea and vomit since yesterday ? ?Review of Systems  ?Positive: Vomit, abd discomfort, fever ?Negative: Dysuria, cough, sore throat, congestion ? ?Physical Exam  ?BP (!) 152/83   Pulse 98   Temp 98.7 ?F (37.1 ?C) (Oral)   Resp 16   Ht 5\' 9"  (1.753 m)   Wt 102.1 kg   SpO2 99%   BMI 33.23 kg/m?  ?Gen:   Awake, no distress   ?Resp:  Normal effort  ?MSK:   Moves extremities without difficulty  ?Other:   ? ?Medical Decision Making  ?Medically screening exam initiated at 7:54 PM.  Appropriate orders placed.  Robert Lutz was informed that the remainder of the evaluation will be completed by another provider, this initial triage assessment does not replace that evaluation, and the importance of remaining in the ED until their evaluation is complete. ? ? ?  ?Domenic Moras, PA-C ?09/29/21 1958 ? ?

## 2021-10-28 ENCOUNTER — Telehealth (HOSPITAL_COMMUNITY): Payer: Medicaid Other | Admitting: Psychiatry

## 2021-11-04 ENCOUNTER — Encounter (HOSPITAL_COMMUNITY): Payer: Self-pay

## 2021-11-04 ENCOUNTER — Telehealth (HOSPITAL_COMMUNITY): Payer: Medicaid Other | Admitting: Psychiatry

## 2021-12-01 ENCOUNTER — Telehealth (INDEPENDENT_AMBULATORY_CARE_PROVIDER_SITE_OTHER): Payer: Medicaid Other | Admitting: Psychiatry

## 2021-12-01 DIAGNOSIS — F319 Bipolar disorder, unspecified: Secondary | ICD-10-CM

## 2021-12-01 NOTE — Progress Notes (Signed)
BH MD/PA/NP OP Progress Note  12/01/2021 5:00 PM Robert KosRobert A Hazell  MRN:  [id number]  Virtual Visit via Telephone Note  I connected with Robert Lutz on 12/01/21 at  5:30 PM EDT by telephone and verified that I am speaking with the correct person using two identifiers.  Location: Patient: home Provider: offsite   I discussed the limitations, risks, security and privacy concerns of performing an evaluation and management service by telephone and the availability of in person appointments. I also discussed with the patient that there may be a patient responsible charge related to this service. The patient expressed understanding and agreed to proceed.    I discussed the assessment and treatment plan with the patient. The patient was provided an opportunity to ask questions and all were answered. The patient agreed with the plan and demonstrated an understanding of the instructions.   The patient was advised to call back or seek an in-person evaluation if the symptoms worsen or if the condition fails to improve as anticipated.  I provided 10 minutes of non-face-to-face time during this encounter.   Mcneil Sobericely Adain Geurin, NP   Chief Complaint: Medication management  HPI: Robert CorporalRobert Lutz is a 36 year old male [facility] follow[facility]up psychiatric evaluation.  Patient has a psychiatric history of bipolar disorder and major depressive disorder.  His symptoms are managed with Seroquel 100 mg at bedtime.  Patient reports that his medication is effective with managing his symptoms and that he is medication compliant.  Patient denies adverse medication effects or need for dosage adjustment today.  No medication changes today.   Visit Diagnosis: No diagnosis found.  Past Psychiatric History: Bipolar disorder and major depressive disorder  Past Medical History:  Past Medical History:  Diagnosis Date   Asthma    Chronic bronchitis    History of Helicobacter  pylori infection 02/03/2020   No past surgical history on file.  Family Psychiatric History: N/A  Family History:  Family History  Problem Relation Age of Onset   Diabetes Mother    Diabetes Father     Social History:  Social History   Socioeconomic History   Marital status: Married    Spouse name: Not on file   Number of children: Not on file   Years of education: Not on file   Highest education level: Not on file  Occupational History   Not on file  Tobacco Use   Smoking status: Some Days    Packs/day: 0.50    Years: 0.00    Pack years: 0.00    Types: Cigarettes   Smokeless tobacco: Never  Vaping Use   Vaping Use: Never used  Substance and Sexual Activity   Alcohol use: Not Currently   Drug use: Not Currently    Types: Marijuana   Sexual activity: Not on file  Other Topics Concern   Not on file  Social History Narrative   Not on file   Social Determinants of Health   Financial Resource Strain: Not on file  Food Insecurity: Not on file  Transportation Needs: Not on file  Physical Activity: Not on file  Stress: Not on file  Social Connections: Not on file    Allergies: No Known Allergies  Metabolic Disorder Labs: No results found for: HGBA1C, MPG No results found for: PROLACTIN No results found for: CHOL, TRIG, HDL, CHOLHDL, VLDL, LDLCALC No results found for: TSH  Therapeutic Level Labs: No results found for: LITHIUM No results found for: VALPROATE No  components found for:  CBMZ  Current Medications: Current Outpatient Medications  Medication Sig Dispense Refill   amoxicillin-clavulanate (AUGMENTIN) 875-125 MG tablet Take 1 tablet by mouth every 12 (twelve) hours. 14 tablet 0   doxycycline (VIBRAMYCIN) 100 MG capsule Take 1 capsule (100 mg total) by mouth 2 (two) times daily. 20 capsule 0   promethazine-dextromethorphan (PROMETHAZINE-DM) 6.25-15 MG/5ML syrup Take 5 mLs by mouth at bedtime as needed for cough. 118 mL 0   QUEtiapine (SEROQUEL) 100  MG tablet Take 1 tablet (100 mg total) by mouth at bedtime. 30 tablet 3   No current facility-administered medications for this visit.     Musculoskeletal: Strength & Muscle Tone: N/A virtual visit Gait & Station: N/A Patient leans: N/A  Psychiatric Specialty Exam: Review of Systems  Psychiatric/Behavioral:  Negative for hallucinations, self-injury and suicidal ideas.   All other systems reviewed and are negative.  There were no vitals taken for this visit.There is no height or weight on file to calculate BMI.  General Appearance: NA  Eye Contact:  NA  Speech:  Clear and Coherent  Volume:  Normal  Mood:  Euthymic  Affect:  NA  Thought Process:  Coherent  Orientation:  Full (Time, Place, and Person)  Thought Content: Logical   Suicidal Thoughts:  No  Homicidal Thoughts:  No  Memory: Good  Judgement:  Good  Insight:  Good  Psychomotor Activity:  NA  Concentration: Good  Recall:  Good  Fund of Knowledge: Good  Language: Good  Akathisia:  NA  Handed:  Right  AIMS (if indicated): not done  Assets:  Communication Skills  ADL's:  Intact  Cognition: WNL  Sleep:  Good   Screenings: GAD-7    Flowsheet Row Video Visit from 08/18/2021 in [facility] Clinical Support from 05/26/2021 in [facility] Video Visit from 02/23/2021 in [facility]  Total GAD-7 Score 2 16 19       PHQ2-9    Flowsheet Row Video Visit from 08/18/2021 in [facility] Clinical Support from 05/26/2021 in [facility] Video Visit from 02/23/2021 in [facility]or from 02/17/2021 in [facility]  PHQ-2 Total Score 0 2 6 6   PHQ-9 Total Score 2 9 24 24       Flowsheet Row ED from 09/29/2021 in [facility] EMERGENCY DEPARTMENT ED from 06/20/2021 in [facility] EM[facility]GENCY DE[facility]  Video Visit from 02/23/2021 in [facility]  C-SSRS RISK CATEGORY No Risk No Risk No Risk        Assessment and Plan: Robert Lutz is a 36 year old male presenting to [facility] behavioral health outpatient for follow-up psychiatric evaluation.  Patient has a psychiatric history of bipolar disorder and major depressive disorder.  His symptoms are managed with Seroquel 100 mg at bedtime.  Patient reports that his medication is effective with managing his symptoms and that he is medication compliant.  Patient denies adverse medication effects or need for dosage adjustment today.  No medication changes today.  Seroquel refilled at current dosage.    Collaboration of Care: Collaboration of Care: Medication Management AEB medication E scribed to patient's preferred pharmacy. 1. Bipolar I disorder (HCC)  - QUEtiapine (SEROQUEL) 100 MG tablet; Take 1 tablet (100 mg total) by mouth at bedtime.  Dispense: 30 tablet; Refill: 0   Return to care in 3 months    Patient/Guardian was  advised Release of Information must be obtained prior to any record release in order to collaborate their care with an outside provider. Patient/Guardian was advised if they have not already done so to contact the registration department to sign all necessary forms in order for [place] to release information regarding their care.   Consent: Patient/Guardian gives verbal consent for treatment and assignment of benefits for services provided during this visit. Patient/Guardian expressed understanding and agreed to proceed.    Mcneil Sober, NP 12/01/2021, 5:00 PM

## 2021-12-02 MED ORDER — QUETIAPINE FUMARATE 100 MG PO TABS: 100.0000 mg | ORAL_TABLET | Freq: Every day | ORAL | 0 refills | Status: DC

## 2022-03-03 ENCOUNTER — Telehealth (HOSPITAL_COMMUNITY): Payer: Medicaid Other | Admitting: Psychiatry

## 2022-03-03 ENCOUNTER — Encounter (HOSPITAL_COMMUNITY): Payer: Self-pay

## 2022-04-11 ENCOUNTER — Ambulatory Visit
Admission: EM | Admit: 2022-04-11 | Discharge: 2022-04-11 | Disposition: A | Discharge status: Home / Self Care | Payer: Medicaid Other | Attending: Urgent Care | Admitting: Urgent Care

## 2022-04-11 DIAGNOSIS — J4520 Mild intermittent asthma, uncomplicated: Secondary | ICD-10-CM

## 2022-04-11 DIAGNOSIS — R052 Subacute cough: Secondary | ICD-10-CM | POA: Diagnosis not present

## 2022-04-11 DIAGNOSIS — B9789 Other viral agents as the cause of diseases classified elsewhere: Secondary | ICD-10-CM | POA: Diagnosis present

## 2022-04-11 DIAGNOSIS — Z79899 Other long term (current) drug therapy: Secondary | ICD-10-CM | POA: Insufficient documentation

## 2022-04-11 DIAGNOSIS — Z8709 Personal history of other diseases of the respiratory system: Secondary | ICD-10-CM | POA: Diagnosis not present

## 2022-04-11 DIAGNOSIS — F1210 Cannabis abuse, uncomplicated: Secondary | ICD-10-CM | POA: Diagnosis not present

## 2022-04-11 DIAGNOSIS — Z20822 Contact with and (suspected) exposure to covid-19: Secondary | ICD-10-CM | POA: Diagnosis not present

## 2022-04-11 DIAGNOSIS — J988 Other specified respiratory disorders: Secondary | ICD-10-CM | POA: Insufficient documentation

## 2022-04-11 DIAGNOSIS — R0981 Nasal congestion: Secondary | ICD-10-CM | POA: Diagnosis not present

## 2022-04-11 MED ORDER — PROMETHAZINE-DM 6.25-15 MG/5ML PO SYRP: 2.5000 mL | ORAL_SOLUTION | Freq: Three times a day (TID) | ORAL | 0 refills | Status: DC | PRN

## 2022-04-11 MED ORDER — FAMOTIDINE 20 MG PO TABS: 20.0000 mg | ORAL_TABLET | Freq: Two times a day (BID) | ORAL | 0 refills | Status: AC

## 2022-04-11 MED ORDER — OMEPRAZOLE 20 MG PO CPDR: 20.0000 mg | DELAYED_RELEASE_CAPSULE | Freq: Every day | ORAL | 0 refills | Status: AC

## 2022-04-11 MED ORDER — CETIRIZINE HCL 10 MG PO TABS: 10.0000 mg | ORAL_TABLET | Freq: Every day | ORAL | 0 refills | Status: DC

## 2022-04-11 MED ORDER — PREDNISONE 20 MG PO TABS: ORAL_TABLET | ORAL | 0 refills | Status: DC

## 2022-04-11 NOTE — ED Triage Notes (Signed)
Pt c/o congestion and yellow drainage that started about 2-3 days ago. The patient also c/o acid reflux.   Home interventions: alka seltzer, ACV

## 2022-04-11 NOTE — ED Provider Notes (Signed)
[facility]  Note:  This document was prepared using Conservation officer, historic buildings and may include unintentional dictation errors.  MRN: [id number] DOB: 1985-12-08  Subjective:   Robert Lutz is a 36 y.o. male presenting for 3-day history of acute onset recurrent sinus congestion, postnasal drainage, intermittent coughing.  Has a history of mild intermittent asthma.  Has not needed an inhaler.  Used to have chronic bronchitis and sinus infections but does not have as much difficulty with this now.  He does smoke marijuana daily, multiple times.  He also wanted to ask about H. pylori and acid reflux.  Has a remote history of having this infection.  Cannot recall if he got treated.  He does remember that he was supposed to see a gastroenterologist but ended up declining the work-up including the endoscopy.  In general does not feel any particular persistent abdominal pain but is worried he may have acid reflux and would like medications for this.  No chest pain, shortness of breath or wheezing, nausea, vomiting, bloody stools, constipation.  No current facility-administered medications for this encounter.  Current Outpatient Medications:    QUEtiapine (SEROQUEL) 100 MG tablet, Take 1 tablet (100 mg total) by mouth at bedtime., Disp: 30 tablet, Rfl: 0   No Known Allergies  Past Medical History:  Diagnosis Date   Asthma    Chronic bronchitis    History of Helicobacter pylori infection 02/03/2020     History reviewed. No pertinent surgical history.  Family History  Problem Relation Age of Onset   Diabetes Mother    Diabetes Father     Social History   Tobacco Use   Smoking status: Some Days    Packs/day: 0.50    Years: 0.00    Total pack years: 0.00    Types: Cigarettes   Smokeless tobacco: Never  Vaping Use   Vaping Use: Never used  Substance Use Topics   Alcohol use: Not Currently   Drug use: Not Currently    Types: Marijuana     ROS   Objective:   Vitals: BP 116/66 (BP Location: Left Arm)   Pulse 63   Temp 97.9 F (36.6 C) (Oral)   Resp 18   SpO2 97%   Physical Exam Constitutional:      General: He is not in acute distress.    Appearance: Normal appearance. He is well-developed and normal weight. He is not ill-appearing, toxic-appearing or diaphoretic.  HENT:     Head: Normocephalic and atraumatic.     Right Ear: Tympanic membrane, ear canal and external ear normal. There is no impacted cerumen.     Left Ear: Tympanic membrane, ear canal and external ear normal. There is no impacted cerumen.     Nose: Congestion and rhinorrhea present.     Mouth/Throat:     Mouth: Mucous membranes are moist.     Pharynx: No oropharyngeal exudate or posterior oropharyngeal erythema.  Eyes:     General: No scleral icterus.       Right eye: No discharge.        Left eye: No discharge.     Extraocular Movements: Extraocular movements intact.     Conjunctiva/sclera: Conjunctivae normal.  Cardiovascular:     Rate and Rhythm: Normal rate and regular rhythm.     Heart sounds: Normal heart sounds. No murmur heard.    No friction rub. No gallop.  Pulmonary:     Effort: Pulmonary effort is normal. No respiratory distress.  Breath sounds: Normal breath sounds. No stridor. No wheezing, rhonchi or rales.  Abdominal:     General: Bowel sounds are normal. There is no distension.     Palpations: Abdomen is soft. There is no mass.     Tenderness: There is no abdominal tenderness. There is no right CVA tenderness, left CVA tenderness, guarding or rebound.  Musculoskeletal:     Cervical back: Normal range of motion and neck supple. No rigidity. No muscular tenderness.  Neurological:     General: No focal deficit present.     Mental Status: He is alert and oriented to person, place, and time.  Psychiatric:        Mood and Affect: Mood normal.        Behavior: Behavior normal.        Thought Content: Thought content  normal.        Judgment: Judgment normal.     Assessment and Plan :   PDMP not reviewed this encounter.  1. Viral respiratory illness   2. Sinus congestion   3. Subacute cough   4. Mild intermittent asthma without complication   5. History of chronic bronchitis   6. Marijuana abuse     In the context of his marijuana use, asthma and bronchitis, recommended an oral prednisone course.  He declined an albuterol inhaler.  Use supportive care otherwise for suspected viral respiratory illness.  COVID-19 testing pending.  I did provide patient with medications for acid reflux and encouraged him to consider getting a second opinion with a different gastroenterology practice.  Provided him with information to [facility] gastroenterology.  Low suspicion for an acute abdomen.  Counseled patient on potential for adverse effects with medications prescribed/recommended today, ER and return-to-clinic precautions discussed, patient verbalized understanding.    Jaynee Eagles, [place] 04/11/22 1206

## 2022-04-12 LAB — SARS CORONAVIRUS 2 (TAT 6-24 HRS): SARS Coronavirus 2: NEGATIVE

## 2022-07-08 ENCOUNTER — Encounter (HOSPITAL_COMMUNITY): Payer: Self-pay | Admitting: Emergency Medicine

## 2022-07-08 ENCOUNTER — Ambulatory Visit (HOSPITAL_COMMUNITY)
Admission: EM | Admit: 2022-07-08 | Discharge: 2022-07-08 | Disposition: A | Discharge status: Home / Self Care | Payer: Medicaid Other | Attending: Physician Assistant | Admitting: Physician Assistant

## 2022-07-08 DIAGNOSIS — R051 Acute cough: Secondary | ICD-10-CM | POA: Insufficient documentation

## 2022-07-08 DIAGNOSIS — J22 Unspecified acute lower respiratory infection: Secondary | ICD-10-CM | POA: Insufficient documentation

## 2022-07-08 DIAGNOSIS — Z1152 Encounter for screening for COVID-19: Secondary | ICD-10-CM | POA: Insufficient documentation

## 2022-07-08 DIAGNOSIS — J069 Acute upper respiratory infection, unspecified: Secondary | ICD-10-CM

## 2022-07-08 MED ORDER — AZITHROMYCIN 250 MG PO TABS: ORAL_TABLET | ORAL | 0 refills | Status: DC

## 2022-07-08 MED ORDER — DM-GUAIFENESIN ER 30-600 MG PO TB12: 1.0000 | ORAL_TABLET | Freq: Two times a day (BID) | ORAL | 0 refills | Status: DC

## 2022-07-08 NOTE — ED Triage Notes (Signed)
Pt c/o congestion and cough for 4 days. Reports taking OTC sinus medications without relief.

## 2022-07-08 NOTE — ED Provider Notes (Signed)
[facility]    CSN: [id number] Arrival date & time: 07/08/22  1403      History   Chief Complaint Chief Complaint  Patient presents with   Cough   Nasal Congestion    HPI Robert Lutz is a 36 y.o. male.   36 year old male presents with cough and congestion.  Patient indicates for the past 4 days he has been having increasing upper respiratory congestion with frontal and maxillary sinus pain pressure and tenderness.  Patient indicates that the upper respiratory congestion has purulent production.  Patient also indicates he has been having some postnasal drip, chest congestion with intermittent cough and rawness feeling in the chest when he does cough.  He relates that the congestion has been thick and green discolored.  Patient indicates he has had some mild chills and body aches.  He indicates he has been using a steam room in the sauna which has helped improved his symptoms.  Patient indicates that he has children at home have similar type symptoms.  He denies any wheezing or shortness of breath and no nausea or vomiting.  He indicates he does smoke on a regular basis.   Cough Associated symptoms: rhinorrhea     Past Medical History:  Diagnosis Date   Asthma    Chronic bronchitis    History of Helicobacter pylori infection 02/03/2020    Patient Active Problem List   Diagnosis Date Noted   Bipolar I disorder (HCC) 02/23/2021   Major depressive disorder, recurrent episode, moderate (HCC) 02/17/2021   History of Helicobacter pylori infection 02/03/2020   Asthma 02/03/2020   Chronic bronchitis (HCC) 02/03/2020    History reviewed. No pertinent surgical history.     Home Medications    Prior to Admission medications   Medication Sig Start Date End Date Taking? Authorizing Provider  azithromycin (ZITHROMAX Z-PAK) 250 MG tablet 2 tablets initially, then 1 tablet daily for next 5 days. 07/08/22  Yes Ellsworth Lennox, PA-C  dextromethorphan-guaiFENesin [facility]  DM) 30-600 MG 12hr tablet Take 1 tablet by mouth 2 (two) times daily. 07/08/22  Yes Ellsworth Lennox, PA-C  cetirizine (ZYRTEC ALLERGY) 10 MG tablet Take 1 tablet (10 mg total) by mouth daily. 04/11/22   Wallis Bamberg, PA-C  famotidine (PEPCID) 20 MG tablet Take 1 tablet (20 mg total) by mouth 2 (two) times daily. 04/11/22   Wallis Bamberg, PA-C  omeprazole (PRILOSEC) 20 MG capsule Take 1 capsule (20 mg total) by mouth daily. 04/11/22   Wallis Bamberg, PA-C  predniSONE (DELTASONE) 20 MG tablet Take 2 tablets daily with breakfast. 04/11/22   Wallis Bamberg, PA-C  promethazine-dextromethorphan (PROMETHAZINE-DM) 6.25-15 MG/5ML syrup Take 2.5 mLs by mouth 3 (three) times daily as needed for cough. 04/11/22   Wallis Bamberg, PA-C  QUEtiapine (SEROQUEL) 100 MG tablet Take 1 tablet (100 mg total) by mouth at bedtime. 12/02/21   Penn, Cranston Neighbor, NP  fluticasone (FLONASE) 50 MCG/ACT nasal spray Place 2 sprays into both nostrils daily. 06/03/15 12/20/19  Lyndal Pulley, MD    Family History Family History  Problem Relation Age of Onset   Diabetes Mother    Diabetes Father     Social History Social History   Tobacco Use   Smoking status: Some Days    Packs/day: 0.50    Years: 0.00    Total pack years: 0.00    Types: Cigarettes   Smokeless tobacco: Never  Vaping Use   Vaping Use: Never used  Substance Use Topics   Alcohol use: Not Currently  Drug use: Not Currently    Types: Marijuana     Allergies   Patient has no known allergies.   Review of Systems Review of Systems  HENT:  Positive for postnasal drip, rhinorrhea and sinus pressure.   Respiratory:  Positive for cough.      Physical Exam Triage Vital Signs ED Triage Vitals [07/08/22 1521]  Enc Vitals Group     BP 116/62     Pulse Rate 80     Resp 18     Temp 98 F (36.7 C)     Temp Source Oral     SpO2 98 %     Weight      Height      Head Circumference      Peak Flow      Pain Score 0     Pain Loc      Pain Edu?      Excl. in GC?    No  data found.  Updated Vital Signs BP 116/62 (BP Location: Left Arm)   Pulse 80   Temp 98 F (36.7 C) (Oral)   Resp 18   SpO2 98%   Visual Acuity Right Eye Distance:   Left Eye Distance:   Bilateral Distance:    Right Eye Near:   Left Eye Near:    Bilateral Near:     Physical Exam Constitutional:      Appearance: Normal appearance.  HENT:     Right Ear: Tympanic membrane and ear canal normal.     Left Ear: Tympanic membrane and ear canal normal.     Mouth/Throat:     Mouth: Mucous membranes are moist.     Pharynx: Oropharynx is clear.  Cardiovascular:     Rate and Rhythm: Normal rate and regular rhythm.     Heart sounds: Normal heart sounds.  Pulmonary:     Effort: Pulmonary effort is normal.     Breath sounds: Normal breath sounds and air entry. No wheezing, rhonchi or rales.  Lymphadenopathy:     Cervical: No cervical adenopathy.  Neurological:     Mental Status: He is alert.      UC Treatments / Results  Labs (all labs ordered are listed, but only abnormal results are displayed) Labs Reviewed  SARS CORONAVIRUS 2 (TAT 6-24 HRS)    EKG   Radiology No results found.  Procedures Procedures (including critical care time)  Medications Ordered in UC Medications - No data to display  Initial Impression / Assessment and Plan / UC Course  I have reviewed the triage vital signs and the nursing notes.  Pertinent labs & imaging results that were available during my care of the patient were reviewed by me and considered in my medical decision making (see chart for details).    Plan: 1.  The upper respiratory tract infection be treated with the following: A.  Mucinex DM every 12 hours for cough and congestion. 2.  The acute cough will be treated with the following: A.  Mucinex DM every 12 hours for cough and congestion. 3.  Screening for COVID-19 will be treated with the following: A.  Treatment may be considered depending on the results of the COVID  test. 4.  The lower respiratory tract infection be treated with the following: A.  Zithromax 250 mg, 2 tablets initially and then 1 tablet daily to treat the respiratory infection. 5.  Advised follow-up PCP or return to urgent care as needed. Final Clinical Impressions(s) / UC Diagnoses  Final diagnoses:  Viral upper respiratory tract infection  Acute cough  Encounter for screening for COVID-19  Lower respiratory tract infection     Discharge Instructions      Advised to take Mucinex DM every 12 hours for cough and chest congestion. Advised take the Zithromax 250 mg, 2 tablets initially and then 1 tablet daily until completed to treat the respiratory infection. Advised use Tylenol or ibuprofen as needed for aches and body discomfort. Advised follow-up PCP or return to urgent care as needed.    ED Prescriptions     Medication Sig Dispense Auth. Provider   dextromethorphan-guaiFENesin (MUCINEX DM) 30-600 MG 12hr tablet Take 1 tablet by mouth 2 (two) times daily. 20 tablet Ellsworth Lennox, PA-C   azithromycin (ZITHROMAX Z-PAK) 250 MG tablet 2 tablets initially, then 1 tablet daily for next 5 days. 6 each Ellsworth Lennox, PA-C      PDMP not reviewed this encounter.   Ellsworth Lennox, PA-C 07/08/22 1538

## 2022-07-08 NOTE — Discharge Instructions (Signed)
Advised to take Mucinex DM every 12 hours for cough and chest congestion. Advised take the Zithromax 250 mg, 2 tablets initially and then 1 tablet daily until completed to treat the respiratory infection. Advised use Tylenol or ibuprofen as needed for aches and body discomfort. Advised follow-up PCP or return to urgent care as needed.

## 2022-07-09 LAB — SARS CORONAVIRUS 2 (TAT 6-24 HRS): SARS Coronavirus 2: NEGATIVE

## 2022-10-22 ENCOUNTER — Ambulatory Visit
Admission: EM | Admit: 2022-10-22 | Discharge: 2022-10-22 | Disposition: A | Discharge status: Home / Self Care | Payer: Medicaid Other | Attending: Nurse Practitioner | Admitting: Nurse Practitioner

## 2022-10-22 DIAGNOSIS — Z1152 Encounter for screening for COVID-19: Secondary | ICD-10-CM | POA: Insufficient documentation

## 2022-10-22 DIAGNOSIS — J069 Acute upper respiratory infection, unspecified: Secondary | ICD-10-CM | POA: Diagnosis not present

## 2022-10-22 DIAGNOSIS — B9789 Other viral agents as the cause of diseases classified elsewhere: Secondary | ICD-10-CM | POA: Diagnosis not present

## 2022-10-22 DIAGNOSIS — J329 Chronic sinusitis, unspecified: Secondary | ICD-10-CM

## 2022-10-22 MED ORDER — FLUTICASONE PROPIONATE 50 MCG/ACT NA SUSP: 1.0000 | Freq: Every day | NASAL | 0 refills | Status: AC

## 2022-10-22 MED ORDER — LORATADINE 10 MG PO TABS: 10.0000 mg | ORAL_TABLET | Freq: Every day | ORAL | 0 refills | Status: AC

## 2022-10-22 MED ORDER — PSEUDOEPHEDRINE HCL 60 MG PO TABS: 60.0000 mg | ORAL_TABLET | Freq: Four times a day (QID) | ORAL | 0 refills | Status: DC | PRN

## 2022-10-22 NOTE — Discharge Instructions (Signed)
The clinic will contact you with results of the COVID testing is positive Flonase daily Claritin daily for at least 7 days Sudafed as needed for congestion Nasal rinses such as [place] as tolerated Follow-up with your PCP if your symptoms do not improve Please go to the emergency room if you have any worsening symptoms

## 2022-10-22 NOTE — ED Provider Notes (Signed)
[facility]    CSN: [id number] Arrival date & time: 10/22/22  1055      History   Chief Complaint No chief complaint on file.   HPI Robert Lutz is a 37 y.o. male  presents for evaluation of URI symptoms for 2 days. Patient reports associated symptoms of nasal congestion, sinus pressure, postnasal drip with globus sensation/throat itching. Denies N/V/D, cough, fevers, ear pain, sore throat, body aches, shortness of breath. Patient does not have a hx of asthma.  He does smoke marijuana.  No known sick contacts.  Pt has taken herbal supplements OTC for symptoms. Pt has no other concerns at this time.   HPI  Past Medical History:  Diagnosis Date   Asthma    Chronic bronchitis    History of Helicobacter pylori infection 02/03/2020    Patient Active Problem List   Diagnosis Date Noted   Bipolar I disorder 02/23/2021   Major depressive disorder, recurrent episode, moderate 02/17/2021   History of Helicobacter pylori infection 02/03/2020   Asthma 02/03/2020   Chronic bronchitis 02/03/2020    History reviewed. No pertinent surgical history.     Home Medications    Prior to Admission medications   Medication Sig Start Date End Date Taking? Authorizing Provider  fluticasone (FLONASE) 50 MCG/ACT nasal spray Place 1 spray into both nostrils daily. 10/22/22  Yes Radford Pax, NP  loratadine (CLARITIN) 10 MG tablet Take 1 tablet (10 mg total) by mouth daily. 10/22/22  Yes Radford Pax, NP  pseudoephedrine (SUDAFED) 60 MG tablet Take 1 tablet (60 mg total) by mouth every 6 (six) hours as needed for congestion. 10/22/22  Yes Radford Pax, NP  dextromethorphan-guaiFENesin [facility] DM) 30-600 MG 12hr tablet Take 1 tablet by mouth 2 (two) times daily. 07/08/22   Ellsworth Lennox, PA-C  famotidine (PEPCID) 20 MG tablet Take 1 tablet (20 mg total) by mouth 2 (two) times daily. 04/11/22   Wallis Bamberg, PA-C  omeprazole (PRILOSEC) 20 MG capsule Take 1 capsule (20 mg total) by mouth  daily. 04/11/22   Wallis Bamberg, PA-C  predniSONE (DELTASONE) 20 MG tablet Take 2 tablets daily with breakfast. 04/11/22   Wallis Bamberg, PA-C  promethazine-dextromethorphan (PROMETHAZINE-DM) 6.25-15 MG/5ML syrup Take 2.5 mLs by mouth 3 (three) times daily as needed for cough. 04/11/22   Wallis Bamberg, PA-C  QUEtiapine (SEROQUEL) 100 MG tablet Take 1 tablet (100 mg total) by mouth at bedtime. 12/02/21   Penn, Cranston Neighbor, NP    Family History Family History  Problem Relation Age of Onset   Diabetes Mother    Diabetes Father     Social History Social History   Tobacco Use   Smoking status: Some Days    Packs/day: 0.50    Years: 0.00    Additional pack years: 0.00    Total pack years: 0.00    Types: Cigarettes   Smokeless tobacco: Never  Vaping Use   Vaping Use: Never used  Substance Use Topics   Alcohol use: Not Currently   Drug use: Not Currently    Types: Marijuana     Allergies   Patient has no known allergies.   Review of Systems Review of Systems  HENT:  Positive for congestion and sinus pressure.        Globus sensation and itchy throat     Physical Exam Triage Vital Signs ED Triage Vitals [10/22/22 1138]  Enc Vitals Group     BP 110/77     Pulse Rate 77  Resp 20     Temp 97.7 F (36.5 C)     Temp Source Oral     SpO2 97 %     Weight      Height      Head Circumference      Peak Flow      Pain Score 0     Pain Loc      Pain Edu?      Excl. in GC?    No data found.  Updated Vital Signs BP 110/77 (BP Location: Left Arm)   Pulse 77   Temp 97.7 F (36.5 C) (Oral)   Resp 20   SpO2 97%   Visual Acuity Right Eye Distance:   Left Eye Distance:   Bilateral Distance:    Right Eye Near:   Left Eye Near:    Bilateral Near:     Physical Exam Vitals and nursing note reviewed.  Constitutional:      General: He is not in acute distress.    Appearance: Normal appearance. He is not ill-appearing or toxic-appearing.  HENT:     Head: Normocephalic and  atraumatic.     Right Ear: Tympanic membrane and ear canal normal.     Left Ear: Tympanic membrane and ear canal normal.     Nose: Congestion present.     Right Turbinates: Swollen. Not pale.     Left Turbinates: Swollen. Not pale.     Right Sinus: No maxillary sinus tenderness or frontal sinus tenderness.     Left Sinus: No maxillary sinus tenderness or frontal sinus tenderness.     Mouth/Throat:     Mouth: Mucous membranes are moist.     Pharynx: No oropharyngeal exudate or posterior oropharyngeal erythema.  Eyes:     Pupils: Pupils are equal, round, and reactive to light.  Cardiovascular:     Rate and Rhythm: Normal rate and regular rhythm.     Heart sounds: Normal heart sounds.  Pulmonary:     Effort: Pulmonary effort is normal.     Breath sounds: Normal breath sounds.  Musculoskeletal:     Cervical back: Normal range of motion and neck supple.  Lymphadenopathy:     Cervical: No cervical adenopathy.  Skin:    General: Skin is warm and dry.  Neurological:     General: No focal deficit present.     Mental Status: He is alert and oriented to person, place, and time.  Psychiatric:        Mood and Affect: Mood normal.        Behavior: Behavior normal.      UC Treatments / Results  Labs (all labs ordered are listed, but only abnormal results are displayed) Labs Reviewed  SARS CORONAVIRUS 2 (TAT 6-24 HRS)    EKG   Radiology No results found.  Procedures Procedures (including critical care time)  Medications Ordered in UC Medications - No data to display  Initial Impression / Assessment and Plan / UC Course  I have reviewed the triage vital signs and the nursing notes.  Pertinent labs & imaging results that were available during my care of the patient were reviewed by me and considered in my medical decision making (see chart for details).     Reviewed exam and symptoms with patient.  No red flags. COVID PCR and will contact if positive Discussed viral upper  respiratory illness/viral sinusitis and symptomatic treatment Flonase daily Claritin and Sudafed Advised nasal rinses as tolerated Follow-up with PCP if symptoms  do not improve ER precautions reviewed and patient verbalized understanding Final Clinical Impressions(s) / UC Diagnoses   Final diagnoses:  Viral upper respiratory infection  Viral sinusitis     Discharge Instructions      The clinic will contact you with results of the COVID testing is positive Flonase daily Claritin daily for at least 7 days Sudafed as needed for congestion Nasal rinses such as [place] PentonNettie Potts as tolerated Follow-up with your PCP if your symptoms do not improve Please go to the emergency room if you have any worsening symptoms    ED Prescriptions     Medication Sig Dispense Auth. Provider   fluticasone (FLONASE) 50 MCG/ACT nasal spray Place 1 spray into both nostrils daily. 15.8 mL Radford PaxMayer, Jodi R, NP   pseudoephedrine (SUDAFED) 60 MG tablet Take 1 tablet (60 mg total) by mouth every 6 (six) hours as needed for congestion. 15 tablet Radford PaxMayer, Jodi R, NP   loratadine (CLARITIN) 10 MG tablet Take 1 tablet (10 mg total) by mouth daily. 30 tablet Radford PaxMayer, Jodi R, NP      PDMP not reviewed this encounter.   Radford PaxMayer, Jodi R, NP 10/22/22 [phone]

## 2022-10-22 NOTE — ED Triage Notes (Signed)
Pt presents with c/o a productive cough X 2 days.  States he feels there is something stuck in his throat, states he has some itching in his throat.   States he tried the cough syrup and reports this morning he felt worse.

## 2022-10-23 LAB — SARS CORONAVIRUS 2 (TAT 6-24 HRS): SARS Coronavirus 2: NEGATIVE

## 2022-12-27 ENCOUNTER — Ambulatory Visit (INDEPENDENT_AMBULATORY_CARE_PROVIDER_SITE_OTHER): Payer: Medicaid Other | Admitting: Psychiatry

## 2022-12-27 ENCOUNTER — Encounter (HOSPITAL_COMMUNITY): Payer: Self-pay | Admitting: Psychiatry

## 2022-12-27 DIAGNOSIS — F319 Bipolar disorder, unspecified: Secondary | ICD-10-CM | POA: Diagnosis not present

## 2022-12-27 MED ORDER — QUETIAPINE FUMARATE 100 MG PO TABS: 100.0000 mg | ORAL_TABLET | Freq: Every day | ORAL | 3 refills | Status: DC

## 2022-12-27 NOTE — Progress Notes (Signed)
BH MD/PA/NP OP Progress Note   12/27/2022 9:58 AM Robert Lutz  MRN:  [id number]  Chief Complaint: "Medication helps with the flow and prayer helps with wellness"   HPI:37 year old male seen today for follow up psychiatric evaluation. He has a psychiatric history of bipolar disorder and schizophrenia. He is currently managed on Seroquel 100 mg nightly. He notes that he has been without his medication for three weeks.     Today he was well-groomed, pleasant, cooperative, engaged in conversation.  He informed Clinical research associate that medications helped with his flow and reports that prayer helps with his wellness.  He notes that he attempted to pray daily however notes that his mental health has been declining because he has been without his medications for 3 weeks.  Patient notes that he is more irritable, distractible, has racing thoughts, and has fluctuations in mood.  At times he notes that he becomes depressed and has difficulty attending to ADLs.  He notes that he wants more for himself and his children and notes that his medication will help him accomplish his goals.  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  He informed Clinical research associate that he finds enjoyment in this.    Recently patient notes that his anxiety and depression has been increased.  He notes that he worries about his son who is 3 and is presenting with signs of autism.  He notes that he will be tested in August for this condition.  He also notes that he worries about his 32-month-year-old daughter who mimics what he and his girlfriend do.  Today provider conducted a GAD-7 and patient scored a 21. Provider also conducted PHQ-9 patient scored 14. He endorses having a reduced appetite.  Patient informed Clinical research associate that he works out at [employer] and does intermittent fasting to help reaching weight goals.  He informed Clinical research associate that his sleep is poor noting that he sleeps 3 to 5  hours nightly.  Today he denies SI/HI/VAH or paranoia.    Today he is agreeable to restarting Seroquel 100 mg nightly to help manage anxiety, depression, mood, and sleep. No other concerns noted at this time.    Visit Diagnosis:    ICD-10-CM   1. Bipolar I disorder (HCC)  F31.9 QUEtiapine (SEROQUEL) 100 MG tablet      Past Psychiatric History: bipolar disorder and schizophrenia  Past Medical History:  Past Medical History:  Diagnosis Date   Asthma    Chronic bronchitis    History of Helicobacter pylori infection 02/03/2020   History reviewed. No pertinent surgical history.  Family Psychiatric History: Notes that paternal and maternal family members have undiagnosed mental health conditions  Family History:  Family History  Problem Relation Age of Onset   Diabetes Mother    Diabetes Father     Social History:  Social History   Socioeconomic History   Marital status: Married    Spouse name: Not on file   Number of children: Not on file   Years of education: Not on file   Highest education level: Not on file  Occupational History   Not on file  Tobacco Use   Smoking status: Some Days    Packs/day: 0.50    Years: 0.00    Additional pack years: 0.00    Total pack years: 0.00    Types: Cigarettes   Smokeless tobacco: Never  Vaping Use   Vaping Use: Never used  Substance and Sexual Activity   Alcohol use: Not Currently   Drug use: Not Currently    Types: Marijuana   Sexual activity: Not on file  Other Topics Concern   Not on file  Social History Narrative   Not on file   Social Determinants of Health   Financial Resource Strain: Not on file  Food Insecurity: Not on file  Transportation Needs: Not on file  Physical Activity: Not on file  Stress: Not on file  Social Connections: Not on file    Allergies: No Known Allergies  Metabolic Disorder Labs: No results found for: "HGBA1C", "MPG" No results found for: "PROLACTIN" No results found for: "CHOL",  "TRIG", "HDL", "CHOLHDL", "VLDL", "LDLCALC" No results found for: "TSH"  Therapeutic Level Labs: No results found for: "LITHIUM" No results found for: "VALPROATE" No results found for: "CBMZ"  Current Medications: Current Outpatient Medications  Medication Sig Dispense Refill   dextromethorphan-guaiFENesin (MUCINEX DM) 30-600 MG 12hr tablet Take 1 tablet by mouth 2 (two) times daily. 20 tablet 0   famotidine (PEPCID) 20 MG tablet Take 1 tablet (20 mg total) by mouth 2 (two) times daily. 60 tablet 0   fluticasone (FLONASE) 50 MCG/ACT nasal spray Place 1 spray into both nostrils daily. 15.8 mL 0   loratadine (CLARITIN) 10 MG tablet Take 1 tablet (10 mg total) by mouth daily. 30 tablet 0   omeprazole (PRILOSEC) 20 MG capsule Take 1 capsule (20 mg total) by mouth daily. 90 capsule 0   predniSONE (DELTASONE) 20 MG tablet Take 2 tablets daily with breakfast. 10 tablet 0   promethazine-dextromethorphan (PROMETHAZINE-DM) 6.25-15 MG/5ML syrup Take 2.5 mLs by mouth 3 (three) times daily as needed for cough. 100 mL 0   pseudoephedrine (SUDAFED) 60 MG tablet Take 1 tablet (60 mg total) by mouth every 6 (six) hours as needed for congestion. 15 tablet 0   QUEtiapine (SEROQUEL) 100 MG tablet Take 1 tablet (100 mg total) by mouth at bedtime. 30 tablet 3   No current facility-administered medications for this visit.     Musculoskeletal: Strength & Muscle Tone: within normal limits Gait & Station: normal Patient leans: N/A  Psychiatric Specialty Exam: Review of Systems  There were no vitals taken for this visit.There is no height or weight on file to calculate BMI.  General Appearance: Well Groomed  Eye Contact:  Good  Speech:  Clear and Coherent and Normal Rate  Volume:  Normal  Mood:  Anxious and Depressed,   Affect:  Appropriate and Congruent  Thought Process:  Coherent, Goal Directed, and Linear  Orientation:  Full (Time, Place, and Person)  Thought Content: WDL and Logical   Suicidal  Thoughts:  No  Homicidal Thoughts:  No  Memory:  Immediate;   Good Recent;   Good Remote;   Good  Judgement:  Good  Insight:  Good  Psychomotor Activity:  Normal  Concentration:  Concentration: Good and Attention Span: Good  Recall:  Good  Fund of Knowledge: Good  Language: Good  Akathisia:  No  Handed:  Right  AIMS (if indicated): not done  Assets:  Communication Skills Desire for Improvement Financial Resources/Insurance Housing Intimacy Leisure Time Physical Health Social Support  ADL's:  Intact  Cognition: WNL  Sleep:  Good   Screenings: GAD-7    Flowsheet Row Clinical Support from 12/27/2022 in [facility] Video Visit from 08/18/2021 in [facility] Clinical Support from 05/26/2021 in [facility] Video Visit from  02/23/2021 in [facility]  Total GAD-7 Score 21 2 16 19       PHQ2-9    Flowsheet Row Clinical Support from 12/27/2022 in [facility] Video Visit from 08/18/2021 in [facility] Clinical Support from 05/26/2021 in [facility] Video Visit from 02/23/2021 in [facility] Counselor from 02/17/2021 in [facility]  PHQ-2 Total Score 5 0 2 6 6   PHQ-9 Total Score 13 2 9 24 24       Flowsheet Row ED from 10/22/2022 in [facility]) ED from 07/08/2022 in [facility] at [facility] ED from 04/11/2022 in [facility])  C-SSRS RISK CATEGORY No Risk No Risk No Risk        Assessment and Plan: Patient notes that he has been more anxious, depressed, and irritable since being without his Seroquel. Today he is agreeable to restarting Seroquel 100 mg nightly to help manage anxiety, depression, mood, and sleep.  1. Bipolar I disorder  (HCC)  Restart- QUEtiapine (SEROQUEL) 100 MG tablet; Take 1 tablet (100 mg total) by mouth at bedtime.  Dispense: 30 tablet; Refill: 3   Follow-up in 3 Follow-up with therapy Shanna Cisco, NP 12/27/2022, 9:58 AM

## 2023-02-10 ENCOUNTER — Ambulatory Visit (HOSPITAL_COMMUNITY): Payer: MEDICAID | Admitting: Clinical

## 2023-02-10 DIAGNOSIS — F319 Bipolar disorder, unspecified: Secondary | ICD-10-CM
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
## 2023-03-28 ENCOUNTER — Encounter (HOSPITAL_COMMUNITY): Payer: Self-pay | Admitting: Psychiatry

## 2023-03-28 ENCOUNTER — Telehealth (INDEPENDENT_AMBULATORY_CARE_PROVIDER_SITE_OTHER): Payer: MEDICAID | Admitting: Psychiatry

## 2023-03-28 DIAGNOSIS — F319 Bipolar disorder, unspecified: Secondary | ICD-10-CM

## 2023-03-28 MED ORDER — QUETIAPINE FUMARATE 100 MG PO TABS: 100.0000 mg | ORAL_TABLET | Freq: Every day | ORAL | 3 refills | Status: DC

## 2023-03-28 NOTE — Progress Notes (Signed)
BH MD/PA/NP OP Progress Note Virtual Visit via Video Note  I connected with Robert Lutz on 03/28/23 at  1:00 PM EDT by a video enabled telemedicine application and verified that I am speaking with the correct person using two identifiers.  Location: Patient: Home Provider: Clinic   I discussed the limitations of evaluation and management by telemedicine and the availability of in person appointments. The patient expressed understanding and agreed to proceed.  I provided 30 minutes of non-face-to-face time during this encounter.    03/28/2023 1:23 PM LUCIANO Lutz  MRN:  [id number]  Chief Complaint: "The medication is working"   HPI:37 year old male seen today for follow up psychiatric evaluation. He has a psychiatric history of bipolar disorder and schizophrenia. He is currently managed on Seroquel 100 mg nightly. He notes that his medication is effective in managing his psychiatric condition.  Today he was well-groomed, pleasant, cooperative, engaged in conversation.  He informed Clinical research associate that his medication is working.  He notes that he is less anxious, irritable, or depressed.  Patient informed Clinical research associate that he has been running errands and going to doctor's appointment for himself as well as his children.  He informed Clinical research associate that his children and his significant other adult well.  Patient informed writer that his mood continues to be stable and notes that he has minimal anxiety and depression.  Today provider conducted a GAD-7 and patient 3, at his last visit he scored a 21.  Provider also conducted PHQ-9 and patient scored a 2, at his last visit he scored a 14.  He endorses adequate sleep and appetite.  Since last visit he informed writer that he has gained 6 pounds.  Today he denies SI/HI/VAH or paranoia.    No medication changes made today.  Patient agreeable to medication prescribed.  No other concerns noted at this time.    Visit Diagnosis:    ICD-10-CM   1. Bipolar I disorder  (HCC)  F31.9 QUEtiapine (SEROQUEL) 100 MG tablet       Past Psychiatric History: bipolar disorder and schizophrenia  Past Medical History:  Past Medical History:  Diagnosis Date   Asthma    Chronic bronchitis    History of Helicobacter pylori infection 02/03/2020   No past surgical history on file.  Family Psychiatric History: Notes that paternal and maternal family members have undiagnosed mental health conditions  Family History:  Family History  Problem Relation Age of Onset   Diabetes Mother    Diabetes Father     Social History:  Social History   Socioeconomic History   Marital status: Married    Spouse name: Not on file   Number of children: Not on file   Years of education: Not on file   Highest education level: Not on file  Occupational History   Not on file  Tobacco Use   Smoking status: Some Days    Current packs/day: 0.50    Types: Cigarettes   Smokeless tobacco: Never  Vaping Use   Vaping status: Never Used  Substance and Sexual Activity   Alcohol use: Not Currently   Drug use: Not Currently    Types: Marijuana   Sexual activity: Not on file  Other Topics Concern   Not on file  Social History Narrative   Not on file   Social Determinants of Health   Financial Resource Strain: Not on file  Food Insecurity: Not on file  Transportation Needs: Not on file  Physical Activity: Not on  file  Stress: Not on file  Social Connections: Not on file    Allergies: No Known Allergies  Metabolic Disorder Labs: No results found for: "HGBA1C", "MPG" No results found for: "PROLACTIN" No results found for: "CHOL", "TRIG", "HDL", "CHOLHDL", "VLDL", "LDLCALC" No results found for: "TSH"  Therapeutic Level Labs: No results found for: "LITHIUM" No results found for: "VALPROATE" No results found for: "CBMZ"  Current Medications: Current Outpatient Medications  Medication Sig Dispense Refill   dextromethorphan-guaiFENesin (MUCINEX DM) 30-600 MG 12hr  tablet Take 1 tablet by mouth 2 (two) times daily. 20 tablet 0   famotidine (PEPCID) 20 MG tablet Take 1 tablet (20 mg total) by mouth 2 (two) times daily. 60 tablet 0   fluticasone (FLONASE) 50 MCG/ACT nasal spray Place 1 spray into both nostrils daily. 15.8 mL 0   loratadine (CLARITIN) 10 MG tablet Take 1 tablet (10 mg total) by mouth daily. 30 tablet 0   omeprazole (PRILOSEC) 20 MG capsule Take 1 capsule (20 mg total) by mouth daily. 90 capsule 0   predniSONE (DELTASONE) 20 MG tablet Take 2 tablets daily with breakfast. 10 tablet 0   promethazine-dextromethorphan (PROMETHAZINE-DM) 6.25-15 MG/5ML syrup Take 2.5 mLs by mouth 3 (three) times daily as needed for cough. 100 mL 0   pseudoephedrine (SUDAFED) 60 MG tablet Take 1 tablet (60 mg total) by mouth every 6 (six) hours as needed for congestion. 15 tablet 0   QUEtiapine (SEROQUEL) 100 MG tablet Take 1 tablet (100 mg total) by mouth at bedtime. 30 tablet 3   No current facility-administered medications for this visit.     Musculoskeletal: Strength & Muscle Tone: within normal limits, telehealth visit Gait & Station: normal, telehealth visit Patient leans: N/A  Psychiatric Specialty Exam: Review of Systems  There were no vitals taken for this visit.There is no height or weight on file to calculate BMI.  General Appearance: Well Groomed  Eye Contact:  Good  Speech:  Clear and Coherent and Normal Rate  Volume:  Normal  Mood:  Euthymic,   Affect:  Appropriate and Congruent  Thought Process:  Coherent, Goal Directed, and Linear  Orientation:  Full (Time, Place, and Person)  Thought Content: WDL and Logical   Suicidal Thoughts:  No  Homicidal Thoughts:  No  Memory:  Immediate;   Good Recent;   Good Remote;   Good  Judgement:  Good  Insight:  Good  Psychomotor Activity:  Normal  Concentration:  Concentration: Good and Attention Span: Good  Recall:  Good  Fund of Knowledge: Good  Language: Good  Akathisia:  No  Handed:  Right   AIMS (if indicated): not done  Assets:  Communication Skills Desire for Improvement Financial Resources/Insurance Housing Intimacy Leisure Time Physical Health Social Support  ADL's:  Intact  Cognition: WNL  Sleep:  Good   Screenings: GAD-7    Flowsheet Row Video Visit from 03/28/2023 in [facility] Clinical Support from 12/27/2022 in [facility] Video Visit from 08/18/2021 in [facility] Clinical Support from 05/26/2021 in [facility] Video Visit from 02/23/2021 in [facility]  Total GAD-7 Score 3 21 2 16 19       PHQ2-9    Flowsheet Row Video Visit from 03/28/2023 in [facility] Clinical Support from 12[facility]27/2022 in [facility] Video Visit from 08/18/2021 in [facility] Clinical Support from 05/26/2021 in [facility] Video Visit from 02/23/2021 in [facility]  PHQ-2 Total Score 1 5 0 2 6  PHQ-9 Total Score 2 13 2 9 24       Flowsheet Row ED from 10/22/2022 in [facility] ED from 07/08/2022 in [facility] ED from 04/11/2022 in [facility])  C-SSRS RISK CATEGORY No Risk No Risk No Risk        Assessment and Plan: Patient notes that she is doing well on her current medication regimen. No medication changes made today.  Patient agreeable to medication prescribed.   1. Bipolar I disorder (HCC)  Continue- QUEtiapine (SEROQUEL) 100 MG tablet; Take 1 tablet (100 mg total) by mouth at bedtime.  Dispense: 30 tablet; Refill: 3   Follow-up in 3 Follow-up with therapy Shanna Cisco, NP 03/28/2023, 1:23 PM

## 2023-06-07 ENCOUNTER — Encounter (HOSPITAL_COMMUNITY): Payer: Self-pay | Admitting: Psychiatry

## 2023-06-07 ENCOUNTER — Telehealth (INDEPENDENT_AMBULATORY_CARE_PROVIDER_SITE_OTHER): Payer: MEDICAID | Admitting: Psychiatry

## 2023-06-07 DIAGNOSIS — F319 Bipolar disorder, unspecified: Secondary | ICD-10-CM | POA: Diagnosis not present

## 2023-06-07 MED ORDER — QUETIAPINE FUMARATE 100 MG PO TABS: 100.0000 mg | ORAL_TABLET | Freq: Every day | ORAL | 3 refills | Status: DC

## 2023-06-07 NOTE — Progress Notes (Signed)
BH MD/PA/NP OP Progress Note Virtual Visit via Video Note  I connected with Robert Lutz on 06/07/23 at  1:00 PM EST by a video enabled telemedicine application and verified that I am speaking with the correct person using two identifiers.  Location: Patient: Home Provider: Clinic   I discussed the limitations of evaluation and management by telemedicine and the availability of in person appointments. The patient expressed understanding and agreed to proceed.  I provided 30 minutes of non-face-to-face time during this encounter.    06/07/2023 1:10 PM Robert Lutz  MRN:  [id number]  Chief Complaint: "Things are okay"   HPI:37 year old male seen today for follow up psychiatric evaluation. He has a psychiatric history of bipolar disorder and schizophrenia. He is currently managed on Seroquel 100 mg nightly. He notes that his medication is effective in managing his psychiatric condition.  Today he was well-groomed, pleasant, cooperative, engaged in conversation.  He informed Clinical research associate that his things are going okay. He notes that he has been trying to raise his children. Patient notes that he has been trying to stay organized and balanced so that he stays in a good place mentally. He does note that he is somewhat concerned about his son who is being evaluated for autism. He reports that his son is 3 and his daughter is 18 months.    Despite the above patient notes that his mood, anxiety, and depression are well managed.  He reports that he relies on PTC (prayer, training, and cleansing). He notes that he finds safety in the mosque and gym. Today provider conducted a GAD-7 and patient 6, at his last visit he scored a 3.  Provider also conducted PHQ-9 and patient scored a 3, at his last visit he scored a 2.  He endorses adequate sleep and appetite.  Today he denies SI/HI/VAH or paranoia.    No medication changes made today.  Patient agreeable to medication prescribed.  No other concerns noted  at this time.    Visit Diagnosis:    ICD-10-CM   1. Bipolar I disorder (HCC)  F31.9 QUEtiapine (SEROQUEL) 100 MG tablet        Past Psychiatric History: bipolar disorder and schizophrenia  Past Medical History:  Past Medical History:  Diagnosis Date   Asthma    Chronic bronchitis    History of Helicobacter pylori infection 02/03/2020   No past surgical history on file.  Family Psychiatric History: Notes that paternal and maternal family members have undiagnosed mental health conditions  Family History:  Family History  Problem Relation Age of Onset   Diabetes Mother    Diabetes Father     Social History:  Social History   Socioeconomic History   Marital status: Married    Spouse name: Not on file   Number of children: Not on file   Years of education: Not on file   Highest education level: Not on file  Occupational History   Not on file  Tobacco Use   Smoking status: Some Days    Current packs/day: 0.50    Types: Cigarettes   Smokeless tobacco: Never  Vaping Use   Vaping status: Never Used  Substance and Sexual Activity   Alcohol use: Not Currently   Drug use: Not Currently    Types: Marijuana   Sexual activity: Not on file  Other Topics Concern   Not on file  Social History Narrative   Not on file   Social Determinants of Health  Financial Resource Strain: Not on file  Food Insecurity: Not on file  Transportation Needs: Not on file  Physical Activity: Not on file  Stress: Not on file  Social Connections: Not on file    Allergies: No Known Allergies  Metabolic Disorder Labs: No results found for: "HGBA1C", "MPG" No results found for: "PROLACTIN" No results found for: "CHOL", "TRIG", "HDL", "CHOLHDL", "VLDL", "LDLCALC" No results found for: "TSH"  Therapeutic Level Labs: No results found for: "LITHIUM" No results found for: "VALPROATE" No results found for: "CBMZ"  Current Medications: Current Outpatient Medications  Medication Sig  Dispense Refill   dextromethorphan-guaiFENesin (MUCINEX DM) 30-600 MG 12hr tablet Take 1 tablet by mouth 2 (two) times daily. 20 tablet 0   famotidine (PEPCID) 20 MG tablet Take 1 tablet (20 mg total) by mouth 2 (two) times daily. 60 tablet 0   fluticasone (FLONASE) 50 MCG/ACT nasal spray Place 1 spray into both nostrils daily. 15.8 mL 0   loratadine (CLARITIN) 10 MG tablet Take 1 tablet (10 mg total) by mouth daily. 30 tablet 0   omeprazole (PRILOSEC) 20 MG capsule Take 1 capsule (20 mg total) by mouth daily. 90 capsule 0   predniSONE (DELTASONE) 20 MG tablet Take 2 tablets daily with breakfast. 10 tablet 0   promethazine-dextromethorphan (PROMETHAZINE-DM) 6.25-15 MG/5ML syrup Take 2.5 mLs by mouth 3 (three) times daily as needed for cough. 100 mL 0   pseudoephedrine (SUDAFED) 60 MG tablet Take 1 tablet (60 mg total) by mouth every 6 (six) hours as needed for congestion. 15 tablet 0   QUEtiapine (SEROQUEL) 100 MG tablet Take 1 tablet (100 mg total) by mouth at bedtime. 30 tablet 3   No current facility-administered medications for this visit.     Musculoskeletal: Strength & Muscle Tone: within normal limits, telehealth visit Gait & Station: normal, telehealth visit Patient leans: N/A  Psychiatric Specialty Exam: Review of Systems  There were no vitals taken for this visit.There is no height or weight on file to calculate BMI.  General Appearance: Well Groomed  Eye Contact:  Good  Speech:  Clear and Coherent and Normal Rate  Volume:  Normal  Mood:  Euthymic,   Affect:  Appropriate and Congruent  Thought Process:  Coherent, Goal Directed, and Linear  Orientation:  Full (Time, Place, and Person)  Thought Content: WDL and Logical   Suicidal Thoughts:  No  Homicidal Thoughts:  No  Memory:  Immediate;   Good Recent;   Good Remote;   Good  Judgement:  Good  Insight:  Good  Psychomotor Activity:  Normal  Concentration:  Concentration: Good and Attention Span: Good  Recall:  Good   Fund of Knowledge: Good  Language: Good  Akathisia:  No  Handed:  Right  AIMS (if indicated): not done  Assets:  Communication Skills Desire for Improvement Financial Resources/Insurance Housing Intimacy Leisure Time Physical Health Social Support  ADL's:  Intact  Cognition: WNL  Sleep:  Good   Screenings: GAD-7    Flowsheet Row Video Visit from 06/07/2023 in [facility] Video Visit from 03/28/2023 in [facility] Clinical Support from 12/27/2022 in [facility] Video Visit from 08/18/2021 in [facility] Clinical Support from 05/26/2021 in [facility]  Total GAD-7 Score 6 3 21 2 16       PHQ2-9    Flowsheet Row Video Visit from 06/07/2023 in [facility] Video Visit from 03/28/2023 in  [facility] Clinical Support from 12/27/2022 in [facility] Video Visit from 08/18/2021 in [facility] Clinical Support from 05/26/2021 in [facility]  PHQ-2 Total Score 0 1 5 0 2  PHQ-9 Total Score 3 2 13 2 9       Flowsheet Row ED from 10/22/2022 in [facility]) ED from 07/08/2022 in [facility] at [facility] ED from 04/11/2022 in [facility])  C-SSRS RISK CATEGORY No Risk No Risk No Risk        Assessment and Plan: Patient notes that he is doing well on his current medication regimen. No medication changes made today. Patient agreeable to continue medications as prescribed.   1. Bipolar I disorder (HCC)  Continue- QUEtiapine (SEROQUEL) 100 MG tablet; Take 1 tablet (100 mg total) by mouth at bedtime.  Dispense: 30 tablet; Refill: 3   Follow-up in 3 Follow-up with therapy Shanna Cisco, NP 06/07/2023, 1:10 PM

## 2023-06-15 ENCOUNTER — Other Ambulatory Visit: Payer: Self-pay

## 2023-06-15 ENCOUNTER — Emergency Department (HOSPITAL_COMMUNITY)
Admission: EM | Admit: 2023-06-15 | Discharge: 2023-06-15 | Discharge status: Against Medical Advice | Payer: MEDICAID | Attending: Emergency Medicine | Admitting: Emergency Medicine

## 2023-06-15 DIAGNOSIS — W268XXA Contact with other sharp object(s), not elsewhere classified, initial encounter: Secondary | ICD-10-CM | POA: Insufficient documentation

## 2023-06-15 DIAGNOSIS — S61211A Laceration without foreign body of left index finger without damage to nail, initial encounter: Secondary | ICD-10-CM | POA: Insufficient documentation

## 2023-06-15 DIAGNOSIS — Z5321 Procedure and treatment not carried out due to patient leaving prior to being seen by health care provider: Secondary | ICD-10-CM | POA: Diagnosis not present

## 2023-06-15 NOTE — ED Notes (Signed)
Pt cussing and acting out in lobby. Pt asked "what are they doing back there? Are they short staff?" Pt also stated "will be back tomorrow" witness leaving ED.

## 2023-06-15 NOTE — ED Notes (Signed)
Pt unwrapping finger dressing in lobby.

## 2023-06-15 NOTE — ED Notes (Signed)
Dressing applied by NT in triage.

## 2023-06-15 NOTE — ED Notes (Signed)
Pt came to triage door stating the dressing fell off, in triage wrapping and unwrapping dressing. Dressing reinforced.

## 2023-06-15 NOTE — ED Triage Notes (Signed)
Pt here for laceration to L index finger with a piece of metal tongs at approx. 4pm. Bleeding controlled upon arrival to ED. Approximate 1 inch laceration noted.

## 2023-06-16 ENCOUNTER — Encounter: Payer: Self-pay | Admitting: Emergency Medicine

## 2023-06-16 ENCOUNTER — Ambulatory Visit
Admission: EM | Admit: 2023-06-16 | Discharge: 2023-06-16 | Disposition: A | Discharge status: Home / Self Care | Payer: MEDICAID | Attending: Family Medicine | Admitting: Family Medicine

## 2023-06-16 DIAGNOSIS — Z23 Encounter for immunization: Secondary | ICD-10-CM

## 2023-06-16 DIAGNOSIS — S61213A Laceration without foreign body of left middle finger without damage to nail, initial encounter: Secondary | ICD-10-CM | POA: Diagnosis not present

## 2023-06-16 MED ORDER — TETANUS-DIPHTH-ACELL PERTUSSIS 5-2.5-18.5 LF-MCG/0.5 IM SUSY
0.5000 mL | PREFILLED_SYRINGE | Freq: Once | INTRAMUSCULAR | Status: AC
  Administered 2023-06-16: 0.5 mL via INTRAMUSCULAR

## 2023-06-16 NOTE — Discharge Instructions (Signed)
You have been given a Tdap vaccination to boost your tetanus immunity  Twice a day, wash your wound with soapy water and then apply new antibiotic ointment and a new clean dressing.  Once it is healing in the next few days you can start doing this just once a day.  Take Tylenol or ibuprofen as you needed for pain.  I would ice and elevate your hand today and tomorrow is much as you can.

## 2023-06-16 NOTE — ED Triage Notes (Signed)
Patient here for laceration on his left middle finger on yesterday.  He cut it on some metal tongs.  Patient unsure of his last Tdap.

## 2023-06-16 NOTE — ED Provider Notes (Signed)
[facility]    CSN: [id number] Arrival date & time: 06/16/23  1031      History   Chief Complaint Chief Complaint  Patient presents with   Laceration    HPI Robert Lutz is a 37 y.o. male.    Laceration Here for a cut to his left middle finger.  He was trying to separate some metal tongs and it cut across the palmar surface of his left middle finger over the middle phalanx onto the ulnar side of the finger.  He bandaged it up and is quite bleeding.  He tried to get seen at the emergency room but the wait was long so he ended up leaving.  He is not sure of when his last tetanus was  No allergies to medications  This happened about 3 PM yesterday afternoon and is now just a couple minutes before noon  Past Medical History:  Diagnosis Date   Asthma    Chronic bronchitis    History of Helicobacter pylori infection 02/03/2020    Patient Active Problem List   Diagnosis Date Noted   Bipolar I disorder (HCC) 02/23/2021   Major depressive disorder, recurrent episode, moderate (HCC) 02/17/2021   History of Helicobacter pylori infection 02/03/2020   Asthma 02/03/2020   Chronic bronchitis (HCC) 02/03/2020    History reviewed. No pertinent surgical history.     Home Medications    Prior to Admission medications   Medication Sig Start Date End Date Taking? Authorizing Provider  QUEtiapine (SEROQUEL) 100 MG tablet Take 1 tablet (100 mg total) by mouth at bedtime. 06/07/23  Yes Toy Cookey E, NP  famotidine (PEPCID) 20 MG tablet Take 1 tablet (20 mg total) by mouth 2 (two) times daily. 04/11/22   Wallis Bamberg, PA-C  fluticasone (FLONASE) 50 MCG/ACT nasal spray Place 1 spray into both nostrils daily. 10/22/22   Radford Pax, NP  loratadine (CLARITIN) 10 MG tablet Take 1 tablet (10 mg total) by mouth daily. 10/22/22   Radford Pax, NP  omeprazole (PRILOSEC) 20 MG capsule Take 1 capsule (20 mg total) by mouth daily. 04/11/22   Wallis Bamberg, PA-C    Family  History Family History  Problem Relation Age of Onset   Diabetes Mother    Diabetes Father     Social History Social History   Tobacco Use   Smoking status: Some Days    Current packs/day: 0.50    Types: Cigarettes   Smokeless tobacco: Never  Vaping Use   Vaping status: Never Used  Substance Use Topics   Alcohol use: Not Currently   Drug use: Not Currently    Types: Marijuana     Allergies   Patient has no known allergies.   Review of Systems Review of Systems   Physical Exam Triage Vital Signs ED Triage Vitals  Encounter Vitals Group     BP 06/16/23 1129 (!) 119/53     Systolic BP Percentile --      Diastolic BP Percentile --      Pulse Rate 06/16/23 1129 60     Resp 06/16/23 1129 16     Temp 06/16/23 1129 98 F (36.7 C)     Temp Source 06/16/23 1129 Oral     SpO2 06/16/23 1129 96 %     Weight 06/16/23 1131 188 lb (85.3 kg)     Height 06/16/23 1131 5\' 10"  (1.778 m)     Head Circumference --      Peak Flow --  Pain Score 06/16/23 1131 6     Pain Loc --      Pain Education --      Exclude from Growth Chart --    No data found.  Updated Vital Signs BP (!) 119/53 (BP Location: Right Arm)   Pulse 60   Temp 98 F (36.7 C) (Oral)   Resp 16   Ht 5\' 10"  (1.778 m)   Wt 85.3 kg   SpO2 96%   BMI 26.98 kg/m   Visual Acuity Right Eye Distance:   Left Eye Distance:   Bilateral Distance:    Right Eye Near:   Left Eye Near:    Bilateral Near:     Physical Exam Vitals reviewed.  Constitutional:      General: He is not in acute distress.    Appearance: He is not toxic-appearing.  Musculoskeletal:     Comments: There is a fairly linear laceration that is about 2 cm in length.  It extends from the palmar surface of the left middle finger onto the ulnar side of the finger, overlying the middle phalanx.  It is not bleeding.  And it is fairly shallow.  Skin:    Coloration: Skin is not pale.  Neurological:     Mental Status: He is alert.       UC Treatments / Results  Labs (all labs ordered are listed, but only abnormal results are displayed) Labs Reviewed - No data to display  EKG   Radiology No results found.  Procedures Procedures (including critical care time)  Medications Ordered in UC Medications  Tdap (BOOSTRIX) injection 0.5 mL (has no administration in time range)    Initial Impression / Assessment and Plan / UC Course  I have reviewed the triage vital signs and the nursing notes.  Pertinent labs & imaging results that were available during my care of the patient were reviewed by me and considered in my medical decision making (see chart for details).     Have given him a Tdap booster here.  I discussed with him that he is outside the window of when suture repair would be helpful, and I am not sure that he would have needed it, even if he had gotten seen in a timely manner.  Dressing is applied here and wound care is explained. Final Clinical Impressions(s) / UC Diagnoses   Final diagnoses:  Laceration of left middle finger without foreign body without damage to nail, initial encounter     Discharge Instructions      You have been given a Tdap vaccination to boost your tetanus immunity  Twice a day, wash your wound with soapy water and then apply new antibiotic ointment and a new clean dressing.  Once it is healing in the next few days you can start doing this just once a day.  Take Tylenol or ibuprofen as you needed for pain.  I would ice and elevate your hand today and tomorrow is much as you can.     ED Prescriptions   None    PDMP not reviewed this encounter.   Zenia Resides, MD 06/16/23 1201

## 2023-07-03 ENCOUNTER — Telehealth: Payer: Self-pay

## 2023-07-03 ENCOUNTER — Ambulatory Visit: Payer: MEDICAID | Admitting: Internal Medicine

## 2023-07-03 NOTE — Progress Notes (Unsigned)
[facility] [address] [place] [address] Dept: [phone] Dept Fax: [phone]  New Patient Office Visit  Subjective:   Robert Lutz 1985-09-01 07/03/2023  No chief complaint on file.   HPI: TRAFTON Lutz presents today to establish care at [facility]. Introduced to Publishing rights manager role and practice setting.  All questions answered.  Concerns: See below   Discussed the use of AI scribe software for clinical note transcription with the patient, who gave verbal consent to proceed.  History of Present Illness             The following portions of the patient's history were reviewed and updated as appropriate: past medical history, past surgical history, family history, social history, allergies, medications, and problem list.   Patient Active Problem List   Diagnosis Date Noted   Bipolar I disorder (HCC) 02/23/2021   Major depressive disorder, recurrent episode, moderate (HCC) 02/17/2021   History of Helicobacter pylori infection 02/03/2020   Asthma 02/03/2020   Chronic bronchitis (HCC) 02/03/2020   Past Medical History:  Diagnosis Date   Asthma    Chronic bronchitis    History of Helicobacter pylori infection 02/03/2020   No past surgical history on file. Family History  Problem Relation Age of Onset   Diabetes Mother    Diabetes Father     Current Outpatient Medications:    famotidine (PEPCID) 20 MG tablet, Take 1 tablet (20 mg total) by mouth 2 (two) times daily., Disp: 60 tablet, Rfl: 0   fluticasone (FLONASE) 50 MCG/ACT nasal spray, Place 1 spray into both nostrils daily., Disp: 15.8 mL, Rfl: 0   loratadine (CLARITIN) 10 MG tablet, Take 1 tablet (10 mg total) by mouth daily., Disp: 30 tablet, Rfl: 0   omeprazole (PRILOSEC) 20 MG capsule, Take 1 capsule (20 mg total) by mouth daily., Disp: 90 capsule, Rfl: 0   QUEtiapine (SEROQUEL) 100 MG tablet, Take 1  tablet (100 mg total) by mouth at bedtime., Disp: 30 tablet, Rfl: 3 No Known Allergies  ROS: A complete ROS was performed with pertinent positives/negatives noted in the HPI. The remainder of the ROS are negative.   Objective:   There were no vitals filed for this visit.  GENERAL: Well-appearing, in NAD. Well nourished.  SKIN: Pink, warm and dry. No rash, lesion, ulceration, or ecchymoses.  NECK: Trachea midline. Full ROM w/o pain or tenderness. No lymphadenopathy.  RESPIRATORY: Chest wall symmetrical. Respirations even and non-labored. Breath sounds clear to auscultation bilaterally.  CARDIAC: S1, S2 present, regular rate and rhythm. Peripheral pulses 2+ bilaterally.  MSK: Muscle tone and strength appropriate for age. Joints w/o tenderness, redness, or swelling.  EXTREMITIES: Without clubbing, cyanosis, or edema.  NEUROLOGIC: No motor or sensory deficits. Steady, even gait.  PSYCH/MENTAL STATUS: Alert, oriented x 3. Cooperative, appropriate mood and affect.   Health Maintenance Due  Topic Date Due   HIV Screening  Never done   Hepatitis C Screening  Never done   INFLUENZA VACCINE  Never done   COVID-19 Vaccine (1 - 2024-25 season) Never done    No results found for any visits on 07/03/23.  Assessment & Plan:  Assessment and Plan               There are no diagnoses linked to this encounter.  No orders of the defined types were placed in this encounter.  No orders of the defined types were placed in this encounter.  No follow-ups on file.   Salvatore Decent, FNP

## 2023-07-03 NOTE — Telephone Encounter (Signed)
12.16.24 new pt no show/did not block for future appt

## 2023-07-06 NOTE — Telephone Encounter (Signed)
Unable to RS at [facility]

## 2023-07-26 ENCOUNTER — Ambulatory Visit
Admission: EM | Admit: 2023-07-26 | Discharge: 2023-07-26 | Disposition: A | Discharge status: Home / Self Care | Payer: MEDICAID | Attending: Family Medicine | Admitting: Family Medicine

## 2023-07-26 DIAGNOSIS — Z113 Encounter for screening for infections with a predominantly sexual mode of transmission: Secondary | ICD-10-CM | POA: Insufficient documentation

## 2023-07-26 DIAGNOSIS — N5089 Other specified disorders of the male genital organs: Secondary | ICD-10-CM | POA: Diagnosis present

## 2023-07-26 DIAGNOSIS — B079 Viral wart, unspecified: Secondary | ICD-10-CM | POA: Insufficient documentation

## 2023-07-26 NOTE — ED Provider Notes (Signed)
 [facility]  Note:  This document was prepared using Conservation officer, historic buildings and may include unintentional dictation errors.  MRN: [id number] DOB: [birth year]  Subjective:   Robert Lutz is a 38 y.o. male presenting for concerns about sexually transmitted infection, herpes simplex, warts. Has multiple lesions in the groin area, one over the posterior left testicle. Denies dysuria, hematuria, urinary frequency, penile discharge, penile swelling, testicular pain, testicular swelling, anal pain, groin pain. He is sexually active with 1 male partner, she has cold sores on her lip. Has been with her a few years.   No current facility-administered medications for this encounter.  Current Outpatient Medications:    famotidine  (PEPCID ) 20 MG tablet, Take 1 tablet (20 mg total) by mouth 2 (two) times daily., Disp: 60 tablet, Rfl: 0   fluticasone  (FLONASE ) 50 MCG/ACT nasal spray, Place 1 spray into both nostrils daily., Disp: 15.8 mL, Rfl: 0   loratadine  (CLARITIN ) 10 MG tablet, Take 1 tablet (10 mg total) by mouth daily., Disp: 30 tablet, Rfl: 0   omeprazole  (PRILOSEC) 20 MG capsule, Take 1 capsule (20 mg total) by mouth daily., Disp: 90 capsule, Rfl: 0   QUEtiapine  (SEROQUEL ) 100 MG tablet, Take 1 tablet (100 mg total) by mouth at bedtime., Disp: 30 tablet, Rfl: 3   No Known Allergies  Past Medical History:  Diagnosis Date   Asthma    Chronic bronchitis    History of Helicobacter pylori infection 02/03/2020     History reviewed. No pertinent surgical history.  Family History  Problem Relation Age of Onset   Diabetes Mother    Diabetes Father     Social History   Tobacco Use   Smoking status: Some Days    Current packs/day: 0.50    Types: Cigarettes   Smokeless tobacco: Never  Vaping Use   Vaping status: Never Used  Substance Use Topics   Alcohol use: Not Currently   Drug use: Not Currently    Types: Marijuana    ROS   Objective:    Vitals: BP 113/67 (BP Location: Left Arm)   Pulse 71   Temp 98.4 F (36.9 C) (Oral)   Resp 16   SpO2 97%   Physical Exam Constitutional:      General: He is not in acute distress.    Appearance: Normal appearance. He is well-developed and normal weight. He is not ill-appearing, toxic-appearing or diaphoretic.  HENT:     Head: Normocephalic and atraumatic.     Right Ear: Tympanic membrane, ear canal and external ear normal. No drainage, swelling or tenderness. No middle ear effusion. There is no impacted cerumen. Tympanic membrane is not erythematous or bulging.     Left Ear: Tympanic membrane, ear canal and external ear normal. No drainage, swelling or tenderness.  No middle ear effusion. There is no impacted cerumen. Tympanic membrane is not erythematous or bulging.     Nose: Nose normal. No congestion or rhinorrhea.     Mouth/Throat:     Mouth: Mucous membranes are moist.     Pharynx: No oropharyngeal exudate or posterior oropharyngeal erythema.  Eyes:     General: No scleral icterus.       Right eye: No discharge.        Left eye: No discharge.     Extraocular Movements: Extraocular movements intact.     Conjunctiva/sclera: Conjunctivae normal.  Cardiovascular:     Rate and Rhythm: Normal rate and regular rhythm.  Heart sounds: Normal heart sounds. No murmur heard.    No friction rub. No gallop.  Pulmonary:     Effort: Pulmonary effort is normal. No respiratory distress.     Breath sounds: Normal breath sounds. No stridor. No wheezing, rhonchi or rales.  Musculoskeletal:     Cervical back: Normal range of motion and neck supple. No rigidity. No muscular tenderness.  Skin:    General: Skin is warm and dry.     Findings: Lesion (multiple lesions throughout) present.     Comments: Scattered hyperpigmented papular lesions in groin area, most consistent with a nevus versus seborrhea. There is a distinct cauliflower lesion with a small stalk over posterior lateral left  testicle. Has a similar lesions over base of radial aspect dorsal lateral thumb. Currently, I do not have the capacity to take images into the chart.   Neurological:     General: No focal deficit present.     Mental Status: He is alert and oriented to person, place, and time.  Psychiatric:        Mood and Affect: Mood normal.        Behavior: Behavior normal.        Thought Content: Thought content normal.        Judgment: Judgment normal.     Assessment and Plan :   PDMP not reviewed this encounter.  1. Genital lesion, male   2. Screen for STD (sexually transmitted disease)   3. Viral wart on right thumb    Recommended STI testing. Has multiple lesions within a differential that includes continuing towards, seborrheic keratosis, nevi, etc. recommended consultation with dermatology.  Referral placed.  No signs of herpes simplex virus, deferred HSV culture.  Will treat as appropriate based off of lab results.   Christopher Savannah, [place] 07/26/23 8190

## 2023-07-26 NOTE — ED Triage Notes (Signed)
 Pt reports nasal congestion and dry cough since this morning. Pt ha snot taken any meds for complaints.

## 2023-07-28 LAB — CYTOLOGY, (ORAL, ANAL, URETHRAL) ANCILLARY ONLY
Chlamydia: NEGATIVE
Comment: NEGATIVE
Comment: NEGATIVE
Comment: NORMAL
Neisseria Gonorrhea: NEGATIVE
Trichomonas: NEGATIVE

## 2023-07-28 LAB — RPR: RPR Ser Ql: NONREACTIVE

## 2023-07-28 LAB — HIV ANTIBODY (ROUTINE TESTING W REFLEX): HIV Screen 4th Generation wRfx: NONREACTIVE

## 2023-08-17 ENCOUNTER — Encounter (HOSPITAL_COMMUNITY): Payer: Self-pay

## 2023-08-17 ENCOUNTER — Telehealth (HOSPITAL_COMMUNITY): Payer: MEDICAID | Admitting: Psychiatry

## 2023-08-24 ENCOUNTER — Ambulatory Visit
Admission: EM | Admit: 2023-08-24 | Discharge: 2023-08-24 | Disposition: A | Discharge status: Home / Self Care | Payer: MEDICAID | Attending: Family Medicine | Admitting: Family Medicine

## 2023-08-24 DIAGNOSIS — J4520 Mild intermittent asthma, uncomplicated: Secondary | ICD-10-CM | POA: Diagnosis not present

## 2023-08-24 MED ORDER — PROMETHAZINE-DM 6.25-15 MG/5ML PO SYRP: 5.0000 mL | ORAL_SOLUTION | Freq: Three times a day (TID) | ORAL | 0 refills | Status: AC | PRN

## 2023-08-24 MED ORDER — PREDNISONE 10 MG PO TABS: 30.0000 mg | ORAL_TABLET | Freq: Every day | ORAL | 0 refills | Status: AC

## 2023-08-24 MED ORDER — ALBUTEROL SULFATE HFA 108 (90 BASE) MCG/ACT IN AERS: 1.0000 | INHALATION_SPRAY | Freq: Four times a day (QID) | RESPIRATORY_TRACT | 0 refills | Status: AC | PRN

## 2023-08-24 NOTE — ED Provider Notes (Signed)
 [facility]  Note:  This document was prepared using Conservation officer, historic buildings and may include unintentional dictation errors.  MRN: [id number] DOB: [birth year]  Subjective:   Robert Lutz is a 38 y.o. male presenting for 4-day history of persistent runny and stuffy nose, coughing.  Has had multiple sick contacts at home.  He has a history of asthma and chronic bronchitis.  Patient is also smoker.  Has used over-the-counter medications with some relief.  No current facility-administered medications for this encounter.  Current Outpatient Medications:    guaifenesin  (ROBITUSSIN) 100 MG/5ML syrup, Take 200 mg by mouth 3 (three) times daily as needed for cough., Disp: , Rfl:    famotidine  (PEPCID ) 20 MG tablet, Take 1 tablet (20 mg total) by mouth 2 (two) times daily., Disp: 60 tablet, Rfl: 0   fluticasone  (FLONASE ) 50 MCG/ACT nasal spray, Place 1 spray into both nostrils daily., Disp: 15.8 mL, Rfl: 0   loratadine  (CLARITIN ) 10 MG tablet, Take 1 tablet (10 mg total) by mouth daily., Disp: 30 tablet, Rfl: 0   omeprazole  (PRILOSEC) 20 MG capsule, Take 1 capsule (20 mg total) by mouth daily., Disp: 90 capsule, Rfl: 0   QUEtiapine  (SEROQUEL ) 100 MG tablet, Take 1 tablet (100 mg total) by mouth at bedtime., Disp: 30 tablet, Rfl: 3   No Known Allergies  Past Medical History:  Diagnosis Date   Asthma    Chronic bronchitis    History of Helicobacter pylori infection 02/03/2020     History reviewed. No pertinent surgical history.  Family History  Problem Relation Age of Onset   Diabetes Mother    Diabetes Father     Social History   Tobacco Use   Smoking status: Some Days    Current packs/day: 0.50    Types: Cigarettes   Smokeless tobacco: Never  Vaping Use   Vaping status: Never Used  Substance Use Topics   Alcohol use: Not Currently   Drug use: Not Currently    Types: Marijuana    ROS   Objective:   Vitals: BP 126/65 (BP Location:  Left Arm)   Pulse 80   Temp 98.7 F (37.1 C) (Oral)   Resp 16   SpO2 97%   Physical Exam Constitutional:      General: He is not in acute distress.    Appearance: Normal appearance. He is well-developed. He is not ill-appearing, toxic-appearing or diaphoretic.  HENT:     Head: Normocephalic and atraumatic.     Right Ear: External ear normal.     Left Ear: External ear normal.     Nose: Nose normal.     Mouth/Throat:     Mouth: Mucous membranes are moist.  Eyes:     General: No scleral icterus.       Right eye: No discharge.        Left eye: No discharge.     Extraocular Movements: Extraocular movements intact.  Cardiovascular:     Rate and Rhythm: Normal rate and regular rhythm.     Heart sounds: Normal heart sounds. No murmur heard.    No friction rub. No gallop.  Pulmonary:     Effort: Pulmonary effort is normal. No respiratory distress.     Breath sounds: No stridor. Rhonchi (light over mid lung fields bilaterally) present. No wheezing or rales.  Neurological:     Mental Status: He is alert and oriented to person, place, and time.  Psychiatric:  Mood and Affect: Mood normal.        Behavior: Behavior normal.        Thought Content: Thought content normal.     Assessment and Plan :   PDMP not reviewed this encounter.  1. Mild intermittent asthmatic bronchitis without complication    Recommended managing for asthmatic bronchitis with prednisone , supportive care.  Refilled albuterol .  Will defer imaging for now.  Counseled patient on potential for adverse effects with medications prescribed/recommended today, ER and return-to-clinic precautions discussed, patient verbalized understanding.    Christopher Savannah, [place] 08/24/23 8341

## 2023-08-24 NOTE — ED Triage Notes (Signed)
 Pt reports runny nose and cough x 3-4 days after his son was sick. Robitussin gives some relief.

## 2023-12-20 ENCOUNTER — Ambulatory Visit
Admission: EM | Admit: 2023-12-20 | Discharge: 2023-12-20 | Disposition: A | Discharge status: Home / Self Care | Payer: MEDICAID | Attending: Family Medicine | Admitting: Family Medicine

## 2023-12-20 DIAGNOSIS — H6691 Otitis media, unspecified, right ear: Secondary | ICD-10-CM | POA: Diagnosis not present

## 2023-12-20 MED ORDER — AMOXICILLIN 875 MG PO TABS: 875.0000 mg | ORAL_TABLET | Freq: Two times a day (BID) | ORAL | 0 refills | Status: AC

## 2023-12-20 NOTE — ED Triage Notes (Signed)
 Pt present with c/o rt ear pain x 4 days, states he might have water in his ear.  Home interventions: OTC ear drops

## 2023-12-20 NOTE — Discharge Instructions (Signed)
 Start amoxicillin  twice daily for 10 days.  You may use over-the-counter Tylenol  or ibuprofen  as needed for your ear pain.  Lots of rest and fluids.  Please follow-up with your PCP if your symptoms do not improve.  Please go to the ER for any worsening symptoms.  Hope you feel better soon!

## 2023-12-20 NOTE — ED Provider Notes (Signed)
 [facility]    CSN: [id number] Arrival date & time: 12/20/23  1455      History   Chief Complaint No chief complaint on file.   HPI Robert Lutz is a 38 y.o. male  presents for evaluation of URI symptoms for 4 days. Patient reports associated symptoms of right ear pain as well as congestion. Denies N/V/D, fevers, cough, sore throat, body aches, shortness of breath. Patient does have a hx of asthma.   Reports sick contacts via family.  Pt has taken over-the-counter eardrops for symptoms. Pt has no other concerns at this time.   HPI  Past Medical History:  Diagnosis Date   Asthma    Chronic bronchitis    History of Helicobacter pylori infection 02/03/2020    Patient Active Problem List   Diagnosis Date Noted   Bipolar I disorder (HCC) 02/23/2021   Major depressive disorder, recurrent episode, moderate (HCC) 02/17/2021   History of Helicobacter pylori infection 02/03/2020   Asthma 02/03/2020   Chronic bronchitis (HCC) 02/03/2020    History reviewed. No pertinent surgical history.     Home Medications    Prior to Admission medications   Medication Sig Start Date End Date Taking? Authorizing Provider  amoxicillin  (AMOXIL ) 875 MG tablet Take 1 tablet (875 mg total) by mouth 2 (two) times daily. 12/20/23  Yes Sonnia Strong, Jodi R, NP  albuterol  (VENTOLIN  HFA) 108 (90 Base) MCG/ACT inhaler Inhale 1-2 puffs into the lungs every 6 (six) hours as needed for wheezing or shortness of breath. 08/24/23   Adolph Hoop, PA-C  famotidine  (PEPCID ) 20 MG tablet Take 1 tablet (20 mg total) by mouth 2 (two) times daily. 04/11/22   Adolph Hoop, PA-C  fluticasone  (FLONASE ) 50 MCG/ACT nasal spray Place 1 spray into both nostrils daily. 10/22/22   Odyssey Vasbinder, Jodi R, NP  guaifenesin  (ROBITUSSIN) 100 MG/5ML syrup Take 200 mg by mouth 3 (three) times daily as needed for cough.    [provider]  loratadine  (CLARITIN ) 10 MG tablet Take 1 tablet (10 mg total) by mouth daily. 10/22/22   Tilden Broz, Jodi  R, NP  omeprazole  (PRILOSEC) 20 MG capsule Take 1 capsule (20 mg total) by mouth daily. 04/11/22   Adolph Hoop, PA-C  predniSONE  (DELTASONE ) 10 MG tablet Take 3 tablets (30 mg total) by mouth daily with breakfast. 08/24/23   Adolph Hoop, PA-C  promethazine -dextromethorphan (PROMETHAZINE -DM) 6.25-15 MG/5ML syrup Take 5 mLs by mouth 3 (three) times daily as needed for cough. 08/24/23   Adolph Hoop, PA-C  QUEtiapine  (SEROQUEL ) 100 MG tablet Take 1 tablet (100 mg total) by mouth at bedtime. 06/07/23   Arlyne Bering, NP    Family History Family History  Problem Relation Age of Onset   Diabetes Mother    Diabetes Father     Social History Social History   Tobacco Use   Smoking status: Some Days    Current packs/day: 0.50    Types: Cigarettes   Smokeless tobacco: Never  Vaping Use   Vaping status: Never Used  Substance Use Topics   Alcohol use: Not Currently   Drug use: Not Currently    Types: Marijuana     Allergies   Patient has no known allergies.   Review of Systems Review of Systems  HENT:  Positive for congestion and ear pain.      Physical Exam Triage Vital Signs ED Triage Vitals  Encounter Vitals Group     BP 12/20/23 1614 111/62     Systolic BP  Percentile --      Diastolic BP Percentile --      Pulse Rate 12/20/23 1613 71     Resp 12/20/23 1613 18     Temp 12/20/23 1613 98.4 F (36.9 C)     Temp Source 12/20/23 1613 Oral     SpO2 12/20/23 1613 96 %     Weight --      Height --      Head Circumference --      Peak Flow --      Pain Score 12/20/23 1612 4     Pain Loc --      Pain Education --      Exclude from Growth Chart --    No data found.  Updated Vital Signs BP 111/62   Pulse 71   Temp 98.4 F (36.9 C) (Oral)   Resp 18   SpO2 96%   Visual Acuity Right Eye Distance:   Left Eye Distance:   Bilateral Distance:    Right Eye Near:   Left Eye Near:    Bilateral Near:     Physical Exam Vitals and nursing note reviewed.   Constitutional:      General: He is not in acute distress.    Appearance: Normal appearance. He is not ill-appearing or toxic-appearing.  HENT:     Head: Normocephalic and atraumatic.     Right Ear: Ear canal normal. A middle ear effusion is present. Tympanic membrane is erythematous.     Left Ear: Tympanic membrane and ear canal normal.     Nose: Congestion present.     Mouth/Throat:     Mouth: Mucous membranes are moist.     Pharynx: No posterior oropharyngeal erythema.  Eyes:     Pupils: Pupils are equal, round, and reactive to light.  Cardiovascular:     Rate and Rhythm: Normal rate and regular rhythm.     Heart sounds: Normal heart sounds.  Pulmonary:     Effort: Pulmonary effort is normal.     Breath sounds: Normal breath sounds.  Musculoskeletal:     Cervical back: Normal range of motion and neck supple.  Lymphadenopathy:     Cervical: No cervical adenopathy.  Skin:    General: Skin is warm and dry.  Neurological:     General: No focal deficit present.     Mental Status: He is alert and oriented to person, place, and time.  Psychiatric:        Mood and Affect: Mood normal.        Behavior: Behavior normal.      UC Treatments / Results  Labs (all labs ordered are listed, but only abnormal results are displayed) Labs Reviewed - No data to display  EKG   Radiology No results found.  Procedures Procedures (including critical care time)  Medications Ordered in UC Medications - No data to display  Initial Impression / Assessment and Plan / UC Course  I have reviewed the triage vital signs and the nursing notes.  Pertinent labs & imaging results that were available during my care of the patient were reviewed by me and considered in my medical decision making (see chart for details).     Reviewed exam and symptoms with patient.  No red flags.  Will start amoxicillin  for right OM.  Discussed OTC analgesics as needed.  PCP follow-up if symptoms do not  improve.  ER precautions reviewed. Final Clinical Impressions(s) / UC Diagnoses   Final diagnoses:  Right otitis  media, unspecified otitis media type     Discharge Instructions      Start amoxicillin  twice daily for 10 days.  You may use over-the-counter Tylenol  or ibuprofen  as needed for your ear pain.  Lots of rest and fluids.  Please follow-up with your PCP if your symptoms do not improve.  Please go to the ER for any worsening symptoms.  Hope you feel better soon!  ED Prescriptions     Medication Sig Dispense Auth. Provider   amoxicillin  (AMOXIL ) 875 MG tablet Take 1 tablet (875 mg total) by mouth 2 (two) times daily. 20 tablet Leyna Vanderkolk, Jodi R, NP      PDMP not reviewed this encounter.   Alleen Arbour, NP 12/20/23 [phone]

## 2024-01-15 ENCOUNTER — Other Ambulatory Visit: Payer: Self-pay

## 2024-01-15 ENCOUNTER — Emergency Department (HOSPITAL_COMMUNITY)
Admission: EM | Admit: 2024-01-15 | Discharge: 2024-01-15 | Disposition: A | Discharge status: Home / Self Care | Payer: MEDICAID | Attending: Emergency Medicine | Admitting: Emergency Medicine

## 2024-01-15 ENCOUNTER — Emergency Department (HOSPITAL_COMMUNITY): Payer: MEDICAID

## 2024-01-15 ENCOUNTER — Encounter (HOSPITAL_COMMUNITY): Payer: Self-pay

## 2024-01-15 DIAGNOSIS — M25562 Pain in left knee: Secondary | ICD-10-CM | POA: Insufficient documentation

## 2024-01-15 MED ORDER — NAPROXEN 500 MG PO TABS: 500.0000 mg | ORAL_TABLET | Freq: Two times a day (BID) | ORAL | 0 refills | Status: AC

## 2024-01-15 MED ORDER — NAPROXEN 500 MG PO TABS
500.0000 mg | ORAL_TABLET | Freq: Once | ORAL | Status: AC
  Administered 2024-01-15: 500 mg via ORAL
  Filled 2024-01-15: qty 1

## 2024-01-15 NOTE — ED Triage Notes (Signed)
 Pt. Arrives for left knee pain x1 week. States that he was in the sauna stretching when he felt a pop. Pt. Is ambulatory.

## 2024-01-15 NOTE — ED Provider Notes (Signed)
 [place] [facility] Provider Note   CSN: [id number] Arrival date & time: 01/15/24  9344     Patient presents with: Knee Pain   Robert Lutz is a 38 y.o. male with no significant past medical history presents to the ED today for knee pain.  Patient reports that about a week ago he was in the sauna at the gym. States that he was able to walk immediately after this occurred and pain has been improving since onset.  He has not been taking anything for the pain at home.  States that he was watching videos on YouTube this morning and was concerned he could have broken something in his knee or if he needed to get his leg amputated.  Patient maintains full range of motion of the knee, no swelling, no fevers.    Prior to Admission medications   Medication Sig Start Date End Date Taking? Authorizing Provider  naproxen (NAPROSYN) 500 MG tablet Take 1 tablet (500 mg total) by mouth 2 (two) times daily for 7 days. 01/15/24 01/22/24 Yes Waddell Sluder, PA-C  albuterol  (VENTOLIN  HFA) 108 (90 Base) MCG/ACT inhaler Inhale 1-2 puffs into the lungs every 6 (six) hours as needed for wheezing or shortness of breath. 08/24/23   Christopher Savannah, PA-C  amoxicillin  (AMOXIL ) 875 MG tablet Take 1 tablet (875 mg total) by mouth 2 (two) times daily. 12/20/23   Mayer, Jodi R, NP  famotidine  (PEPCID ) 20 MG tablet Take 1 tablet (20 mg total) by mouth 2 (two) times daily. 04/11/22   Christopher Savannah, PA-C  fluticasone  (FLONASE ) 50 MCG/ACT nasal spray Place 1 spray into both nostrils daily. 10/22/22   Mayer, Jodi R, NP  guaifenesin  (ROBITUSSIN) 100 MG/5ML syrup Take 200 mg by mouth 3 (three) times daily as needed for cough.    [provider]  loratadine  (CLARITIN ) 10 MG tablet Take 1 tablet (10 mg total) by mouth daily. 10/22/22   Mayer, Jodi R, NP  omeprazole  (PRILOSEC) 20 MG capsule Take 1 capsule (20 mg total) by mouth daily. 04/11/22   Christopher Savannah, PA-C  predniSONE  (DELTASONE ) 10 MG tablet Take  3 tablets (30 mg total) by mouth daily with breakfast. 08/24/23   Christopher Savannah, PA-C  promethazine -dextromethorphan (PROMETHAZINE -DM) 6.25-15 MG/5ML syrup Take 5 mLs by mouth 3 (three) times daily as needed for cough. 08/24/23   Christopher Savannah, PA-C  QUEtiapine  (SEROQUEL ) 100 MG tablet Take 1 tablet (100 mg total) by mouth at bedtime. 06/07/23   Harl Zane BRAVO, NP    Allergies: Patient has no known allergies.    Review of Systems  Musculoskeletal:  Positive for arthralgias.  All other systems reviewed and are negative.   Updated Vital Signs BP (!) 160/98   Pulse 74   Temp (!) 97.4 F (36.3 C) (Oral)   Resp 17   SpO2 100%   Physical Exam Vitals and nursing note reviewed.  Constitutional:      General: He is not in acute distress.    Appearance: Normal appearance.  HENT:     Head: Normocephalic and atraumatic.     Mouth/Throat:     Mouth: Mucous membranes are moist.   Eyes:     Conjunctiva/sclera: Conjunctivae normal.     Pupils: Pupils are equal, round, and reactive to light.    Cardiovascular:     Rate and Rhythm: Normal rate and regular rhythm.     Pulses: Normal pulses.     Heart sounds: Normal heart sounds.  Pulmonary:     Effort: Pulmonary effort is normal.     Breath sounds: Normal breath sounds.   Musculoskeletal:        General: Tenderness present. Normal range of motion.     Cervical back: Normal range of motion.     Comments: Tenderness to the medial left knee.  Full range of motion of knee appreciated.  Strength and sensation intact. Negative Lachman test.   Skin:    General: Skin is warm and dry.     Findings: No rash.   Neurological:     General: No focal deficit present.     Mental Status: He is alert.     Sensory: No sensory deficit.     Motor: No weakness.     Gait: Gait normal.   Psychiatric:        Mood and Affect: Mood normal.        Behavior: Behavior normal.    (all labs ordered are listed, but only abnormal results are displayed) Labs  Reviewed - No data to display  EKG: None  Radiology: DG Knee Complete 4 Views Left Result Date: 01/15/2024 CLINICAL DATA:  Knee pain. EXAM: LEFT KNEE - COMPLETE 4+ VIEW COMPARISON:  None Available. FINDINGS: No evidence of fracture, dislocation, or joint effusion. No evidence of arthropathy or other focal bone abnormality. Soft tissues are unremarkable. IMPRESSION: Negative. Electronically Signed   By: Camellia Candle M.D.   On: 01/15/2024 07:43     Procedures   Medications Ordered in the ED  naproxen (NAPROSYN) tablet 500 mg (500 mg Oral Given 01/15/24 0800)                                    Medical Decision Making Amount and/or Complexity of Data Reviewed Radiology: ordered.  Risk Prescription drug management.   This patient presents to the ED for concern of knee pain, this involves an extensive number of treatment options, and is a complaint that carries with it a high risk of complications and morbidity.   Differential diagnosis includes: fracture, dislocation, muscle strain/spasm, ligamentous injury, contusion, hematoma, gout, etc. Low suspicion for septic arthritis -patient maintains full range of motion of the knee.  No swelling to the knee, erythema, or warmth to touch.   Comorbidities  See HPI above   Additional History  Additional history obtained from prior records   Imaging Studies  Left knee x-ray ordered in triage I independently visualized and interpreted imaging which showed:  No evidence of fracture, dislocation, or joint effusion. No evidence of arthropathy or other focal bone abnormality. Soft tissues are unremarkable. I agree with the radiologist interpretation   Problem List / ED Course / Critical Interventions / Medication Management  Patient presents the ED today for medial left knee pain for the past week.  States that he was stretching in the sauna at the gym and inverted his left knee when he heard a pop.  States that immediately after he  heard this he was able to ambulate on the knee.  States that he was on the stair machine after to make sure he was able to walk on the leg, which he was.  States that the next morning with the pain started to improve on its own.  Since then the leg is starting to feel better.  They will still have some discomfort in the area.  He has not been taking thing for  his pain at home. Patient reports that this morning he was watching videos on YouTube of the injuries and was concerned about lack of blood flow to the leg as well as nerve damage. At some evaluation, patient was able to stand up on his left leg and react exactly how he hurt it.  He maintains full range of motion of the leg.  He is neurovascularly intact. Only endorses to touch of the medial left knee or if he inverts the knee. I ordered medications including: Naproxen for pain - medication given prior to discharge I have reviewed the patients home medicines and have made adjustments as needed   Social Determinants of Health  Physical activity   Test / Admission - Considered  Discussed results with patient.  All questions answered. He is stable for discharge home. Return precautions provided.    Final diagnoses:  Acute pain of left knee    ED Discharge Orders          Ordered    naproxen (NAPROSYN) 500 MG tablet  2 times daily        01/15/24 0749               Waddell Sluder, PA-C 01/15/24 0801    Lenor Hollering, MD 01/15/24 [phone]

## 2024-01-15 NOTE — Discharge Instructions (Addendum)
 Your imaging is reassuring - no signs of bony abnormality. Take Naproxen twice a day as needed for pain.  I have attached information for orthopedics to follow up with for further evaluation if pain persists or worsens.  Get help right away if: Your knee swells and the swelling gets worse. You cannot move your knee. You have very bad knee pain that does not get better with medicine.

## 2024-01-29 ENCOUNTER — Other Ambulatory Visit: Payer: Self-pay | Admitting: Podiatry

## 2024-01-29 ENCOUNTER — Encounter: Payer: Self-pay | Admitting: Podiatry

## 2024-01-29 ENCOUNTER — Ambulatory Visit (INDEPENDENT_AMBULATORY_CARE_PROVIDER_SITE_OTHER): Payer: MEDICAID | Admitting: Podiatry

## 2024-01-29 DIAGNOSIS — M79671 Pain in right foot: Secondary | ICD-10-CM | POA: Diagnosis not present

## 2024-01-29 DIAGNOSIS — L600 Ingrowing nail: Secondary | ICD-10-CM | POA: Diagnosis not present

## 2024-01-29 DIAGNOSIS — M79672 Pain in left foot: Secondary | ICD-10-CM | POA: Diagnosis not present

## 2024-01-29 DIAGNOSIS — B351 Tinea unguium: Secondary | ICD-10-CM | POA: Diagnosis not present

## 2024-01-29 MED ORDER — TERBINAFINE HCL 250 MG PO TABS: 250.0000 mg | ORAL_TABLET | Freq: Every day | ORAL | 0 refills | Status: AC

## 2024-01-29 NOTE — Addendum Note (Signed)
 Addended by: GERRIT ANDREZ CROME on: 01/29/2024 02:47 PM   Modules accepted: Orders

## 2024-01-29 NOTE — Progress Notes (Signed)
   Subjective:    HPI Patient presents complaint of pain around the toes 1 through 5 bilaterally and especially the hallux.  Pain with wearing shoes.  Walking aggravates it also.  Nails are getting worse over the past several years.  Has noticed more yellowing and thickening and crumbling of the nail.  Objective:  Physical Exam   General: AAO x3, NAD  Vascular: DP and PT pulses palpable bilaterally.  Immedate capillary fill time digits. No significant lower extremity edema bilaterally.  Dermatological: Onychomycotic mycotic changes nails 1 through 5 with discoloration nail and subungual debris and thickening of the nail. Tenderness with walking and wearing shoes.  Incurvated ingrown nails hallux bilaterally.  Thickening and nail folds.  Neruologic: Light touch intact normal Achilles tendon reflex bilaterally  Musculoskeletal: Normal foot structure.  Good range of motion ankle and subtalar joint and first metatarsal phalangeal joint muscle strength around foot and ankle  Assessment:  Painful onychomycotic nails 1 through 5 bilaterally. Pain feet b/l Ingrown nails hallux bilaterally     Plan:  -Established office visit level 3 for evaluation and management - Discussed with the patient treatment options for onychomycosis.  Discussed oral Lamisil  versus topical.  Discussed risk and benefits of both.  Patient would like to pursue oral Lamisil  for treatment.  Has no history of renal or hepatic disease. -Rx: Lamisil  250 mg p.o. daily - Labs ordered today for liver function tests to monitor for any hepatic side effects from the Lamisil .  - Return in 4 weeks for  Lamisil  2

## 2024-01-30 LAB — HEPATIC FUNCTION PANEL
ALT: 35 IU/L (ref 0–44)
AST: 26 IU/L (ref 0–40)
Albumin: 4.6 g/dL (ref 4.1–5.1)
Alkaline Phosphatase: 78 IU/L (ref 44–121)
Bilirubin Total: 0.3 mg/dL (ref 0.0–1.2)
Bilirubin, Direct: 0.11 mg/dL (ref 0.00–0.40)
Total Protein: 7.3 g/dL (ref 6.0–8.5)

## 2024-02-08 ENCOUNTER — Encounter (HOSPITAL_COMMUNITY): Payer: MEDICAID | Admitting: Family

## 2024-02-20 ENCOUNTER — Telehealth (HOSPITAL_COMMUNITY): Payer: MEDICAID | Admitting: Clinical

## 2024-02-20 DIAGNOSIS — F319 Bipolar disorder, unspecified: Secondary | ICD-10-CM

## 2024-02-20 NOTE — Progress Notes (Signed)
 THERAPIST PROGRESS NOTE Virtual Visit via Video Note  I connected with Robert Lutz on 02/20/24 at 11:00 AM EDT by a video enabled telemedicine application and verified that I am speaking with the correct person using two identifiers.  Location: Patient: home  Provider: office   I discussed the limitations of evaluation and management by telemedicine and the availability of in person appointments. The patient expressed understanding and agreed to proceed.   Follow Up Instructions: I discussed the assessment and treatment plan with the patient. The patient was provided an opportunity to ask questions and all were answered. The patient agreed with the plan and demonstrated an understanding of the instructions.   The patient was advised to call back or seek an in-person evaluation if the symptoms worsen or if the condition fails to improve as anticipated.   Session Time: 40 min  Participation Level: Active  Behavioral Response: CasualAlertIrritable  Type of Therapy: Individual Therapy  Treatment Goals addressed: client will engage in at least 80% of scheduled individual psychotherapy sessions  ProgressTowards Goals: Progressing  Interventions: CBT and Supportive  Summary:  Robert Lutz is a 38 y.o. male who presents for the scheduled appointment oriented x 5, appropriately dressed, and cooperative.  Client denied hallucinations and delusions. Client reported on today he is needing to get back into his regular services. Client reported due to life circumstances he has been off of his psychiatric medications for a while. Client reported he is looking to get back started to help stabilize his mood.  Client reported his anger is progressively becoming and issue since not having his medication. 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 Client reported he went in trying to inquire about information but he was met with  hostility from the workers. Client reported he was able to walk away but is at risk of impulsively reacting. Client reported he also has issues with family. Client reported since his parents passed away he has not been treated by family very well. Client reported he thought he had their support but they act fake.  Evidence of progress towards goal:  client reported 1 positive of taking steps to improve his anger by seeking outpatient treatment again.  Suicidal/Homicidal: Nowithout intent/plan  Therapist Response:  Therapist began the appointment asking the client how he has been doing. Therapist engaged with active listening and positive emotional support. Therapist used cbt to engage and ask the client clarifying questions about severity of his symptoms. Therapist used cbt to teach the client about how to regulate his emotions. Therapist used cbt to encourage medication compliance. Therapist used CBT ask the client to identify her progress with frequency of use with coping skills with continued practice in her daily activity.    Therapist assigned the client homework to ensure he keeps the psychiatry appointment this week.   Plan: Return again in 4 weeks.  Diagnosis: bipolar 1 disorder  Collaboration of Care: Patient refused AEB none requested by the client.  Patient/Guardian was advised Release of Information must be obtained prior to any record release in order to collaborate their care with an outside provider. Patient/Guardian was advised if they have not already done so to contact the registration department to sign all necessary forms in order for us  to release information regarding their care.   Consent: Patient/Guardian gives verbal consent for treatment and assignment of benefits for services provided during this visit. Patient/Guardian expressed understanding and agreed  to proceed.   Sharline Lehane Y Suvan Stcyr, LCSW 02/20/2024

## 2024-02-22 ENCOUNTER — Encounter (HOSPITAL_COMMUNITY): Payer: Self-pay | Admitting: Family

## 2024-02-22 ENCOUNTER — Telehealth (INDEPENDENT_AMBULATORY_CARE_PROVIDER_SITE_OTHER): Payer: MEDICAID | Admitting: Family

## 2024-02-22 DIAGNOSIS — F319 Bipolar disorder, unspecified: Secondary | ICD-10-CM | POA: Diagnosis not present

## 2024-02-22 MED ORDER — QUETIAPINE FUMARATE 100 MG PO TABS
100.0000 mg | ORAL_TABLET | Freq: Every day | ORAL | 3 refills | Status: DC
Start: 2024-02-22 — End: 2024-04-24

## 2024-02-22 NOTE — Progress Notes (Signed)
 BH MD/PA/NP OP Progress Note Virtual Visit via Video Note  I connected with Robert Lutz on 02/22/24 at 11:30 AM EDT by a video enabled telemedicine application and verified that I am speaking with the correct person using two identifiers.  Location: Patient: Home Provider: Clinic   I discussed the limitations of evaluation and management by telemedicine and the availability of in person appointments. The patient expressed understanding and agreed to proceed.  I provided 30 minutes of non-face-to-face time during this encounter.   02/22/2024 12:01 PM CHELSEA NUSZ  MRN:  [id number]  Chief Complaint: Things are okay   HPI:38 year old male seen today for follow up psychiatric evaluation. He has a psychiatric history of bipolar disorder and schizophrenia. He is currently managed on Seroquel  100 mg nightly. He notes that his medication is effective in managing his psychiatric condition.  Today, the patient was well-groomed, pleasant, cooperative, and engaged in conversation. He reports that things are going "okay" and that he has been focusing on raising his children. He notes that his son, age 45, is currently being evaluated for autism, and his daughter is 1 months old. He states he is making efforts to stay organized and balanced to maintain good mental health.  The patient continues to endorse a desire for stability and shared two recent incidents in which he almost got arrested while at the post office attempting to obtain a P.O. box. He expressed appreciation for the opportunity to discuss these events. We reviewed at length the concept of recidivism, the importance of staying out of jail, and the role of medication in supporting mood stability. Psychoeducation was provided regarding the pathology of bipolar disorder and the need for consistent treatment to manage mood fluctuations.  He reports that his mood, anxiety, and depression remain well managed overall. He relies on PTC  (prayer, training, and cleansing) for coping and finds safety and support in the mosque and gym. He does, however, acknowledge experiencing increased anger issues recently.  A GAD-7 administered today scored 6 (previously 3), and a PHQ-9 scored 3 (previously 2). He reports adequate sleep and appetite, eating and sleeping well. He has agreed to resume therapy as part of his treatment plan.  He denies suicidal ideation, homicidal ideation, auditory or visual hallucinations, paranoia, or aggressive behavior at this time.  No medication changes were made today. The patient remains agreeable to his current prescribed regimen, and no other concerns were noted.    Visit Diagnosis:    ICD-10-CM   1. Bipolar I disorder (HCC)  F31.9 QUEtiapine  (SEROQUEL ) 100 MG tablet        Past Psychiatric History: bipolar disorder and schizophrenia  Past Medical History:  Past Medical History:  Diagnosis Date   Asthma    Chronic bronchitis    History of Helicobacter pylori infection 02/03/2020   No past surgical history on file.  Family Psychiatric History: Notes that paternal and maternal family members have undiagnosed mental health conditions  Family History:  Family History  Problem Relation Age of Onset   Diabetes Mother    Diabetes Father     Social History:  Social History   Socioeconomic History   Marital status: Married    Spouse name: Not on file   Number of children: Not on file   Years of education: Not on file   Highest education level: Not on file  Occupational History   Not on file  Tobacco Use   Smoking status: Some Days    Current  packs/day: 0.50    Types: Cigarettes   Smokeless tobacco: Never  Vaping Use   Vaping status: Never Used  Substance and Sexual Activity   Alcohol use: Not Currently   Drug use: Not Currently    Types: Marijuana   Sexual activity: Yes  Other Topics Concern   Not on file  Social History Narrative   Not on file   Social Drivers of Health    Financial Resource Strain: Not on file  Food Insecurity: Not on file  Transportation Needs: Not on file  Physical Activity: Not on file  Stress: Not on file  Social Connections: Not on file    Allergies: No Known Allergies  Metabolic Disorder Labs: No results found for: HGBA1C, MPG No results found for: PROLACTIN No results found for: CHOL, TRIG, HDL, CHOLHDL, VLDL, LDLCALC No results found for: TSH  Therapeutic Level Labs: No results found for: LITHIUM No results found for: VALPROATE No results found for: CBMZ  Current Medications: Current Outpatient Medications  Medication Sig Dispense Refill   albuterol  (VENTOLIN  HFA) 108 (90 Base) MCG/ACT inhaler Inhale 1-2 puffs into the lungs every 6 (six) hours as needed for wheezing or shortness of breath. 18 g 0   amoxicillin  (AMOXIL ) 875 MG tablet Take 1 tablet (875 mg total) by mouth 2 (two) times daily. 20 tablet 0   famotidine  (PEPCID ) 20 MG tablet Take 1 tablet (20 mg total) by mouth 2 (two) times daily. 60 tablet 0   fluticasone  (FLONASE ) 50 MCG/ACT nasal spray Place 1 spray into both nostrils daily. 15.8 mL 0   guaifenesin  (ROBITUSSIN) 100 MG/5ML syrup Take 200 mg by mouth 3 (three) times daily as needed for cough.     loratadine  (CLARITIN ) 10 MG tablet Take 1 tablet (10 mg total) by mouth daily. 30 tablet 0   omeprazole  (PRILOSEC) 20 MG capsule Take 1 capsule (20 mg total) by mouth daily. 90 capsule 0   predniSONE  (DELTASONE ) 10 MG tablet Take 3 tablets (30 mg total) by mouth daily with breakfast. 15 tablet 0   promethazine -dextromethorphan (PROMETHAZINE -DM) 6.25-15 MG/5ML syrup Take 5 mLs by mouth 3 (three) times daily as needed for cough. 200 mL 0   QUEtiapine  (SEROQUEL ) 100 MG tablet Take 1 tablet (100 mg total) by mouth at bedtime. 30 tablet 3   terbinafine  (LAMISIL ) 250 MG tablet Take 1 tablet (250 mg total) by mouth daily. 30 tablet 0   No current facility-administered medications for this  visit.     Musculoskeletal: Strength & Muscle Tone: within normal limits, telehealth visit Gait & Station: normal, telehealth visit Patient leans: N/A  Psychiatric Specialty Exam: Review of Systems  Psychiatric/Behavioral:  Positive for agitation and behavioral problems. The patient is nervous/anxious.   All other systems reviewed and are negative.   There were no vitals taken for this visit.There is no height or weight on file to calculate BMI.  General Appearance: Well Groomed  Eye Contact:  Good  Speech:  Clear and Coherent and Normal Rate  Volume:  Normal  Mood:  Euthymic,   Affect:  Appropriate and Congruent  Thought Process:  Coherent, Goal Directed, and Linear  Orientation:  Full (Time, Place, and Person)  Thought Content: WDL and Logical   Suicidal Thoughts:  No  Homicidal Thoughts:  No  Memory:  Immediate;   Good Recent;   Good Remote;   Good  Judgement:  Good  Insight:  Good  Psychomotor Activity:  Normal  Concentration:  Concentration: Good and Attention Span: Good  Recall:  Good  Fund of Knowledge: Good  Language: Good  Akathisia:  No  Handed:  Right  AIMS (if indicated): not done  Assets:  Communication Skills Desire for Improvement Financial Resources/Insurance Housing Intimacy Leisure Time Physical Health Social Support  ADL's:  Intact  Cognition: WNL  Sleep:  Good   Screenings: GAD-7    Flowsheet Row Video Visit from 06/07/2023 in [facility] Video Visit from 03/28/2023 in [facility] Clinical Support from 12/27/2022 in [facility] Video Visit from 08/18/2021 in [facility] Clinical Support from 05/26/2021 in [facility]  Total GAD-7 Score 6 3 21 2 16    PHQ2-9    Flowsheet Row Video Visit from 06/07/2023 in [facility] Video Visit from 03/28/2023 in [facility] Clinical Support from 12/27/2022 in [facility] Video Visit from 08/18/2021 in [facility] Clinical Support from 05/26/2021 in [facility]  PHQ-2 Total Score 0 1 5 0 2  PHQ-9 Total Score 3 2 13 2 9    Flowsheet Row ED from 01/15/2024 in [facility] Emergency Department at [facility] UC from 12/20/2023 in [facility] UC from 08/24/2023 in [facility])  C-SSRS RISK CATEGORY No Risk No Risk No Risk     Assessment and Plan: Patient reports increase in his anger at this time and wants to go back on his medicatin. No medication changes made today. Patient agreeable to continue medications as prescribed.   1. Bipolar I disorder (HCC)  Continue- QUEtiapine  (SEROQUEL ) 100 MG tablet; Take 1 tablet (100 mg total) by mouth at bedtime.  Dispense: 30 tablet; Refill: 3   Follow-up in 1 Follow-up with therapy Majel GORMAN Ramp, FNP 02/22/2024, 12:01 PM

## 2024-03-27 ENCOUNTER — Telehealth (HOSPITAL_COMMUNITY): Payer: MEDICAID | Admitting: Psychiatry

## 2024-04-05 ENCOUNTER — Encounter (HOSPITAL_COMMUNITY): Payer: Self-pay

## 2024-04-05 ENCOUNTER — Telehealth (HOSPITAL_COMMUNITY): Payer: MEDICAID | Admitting: Psychiatry

## 2024-04-23 ENCOUNTER — Ambulatory Visit (HOSPITAL_COMMUNITY): Payer: MEDICAID | Admitting: Mental Health

## 2024-04-23 DIAGNOSIS — F319 Bipolar disorder, unspecified: Secondary | ICD-10-CM | POA: Diagnosis not present

## 2024-04-23 NOTE — Progress Notes (Signed)
 THERAPIST PROGRESS NOTE Virtual Visit via Video Note  I connected with Robert Lutz on 04/23/24 at  1:00 PM EDT by a video enabled telemedicine application and verified that I am speaking with the correct person using two identifiers.  Location: Patient: address on file Provider: office   I discussed the limitations of evaluation and management by telemedicine and the availability of in person appointments. The patient expressed understanding and agreed to proceed.  I discussed the assessment and treatment plan with the patient. The patient was provided an opportunity to ask questions and all were answered. The patient agreed with the plan and demonstrated an understanding of the instructions.   The patient was advised to call back or seek an in-person evaluation if the symptoms worsen or if the condition fails to improve as anticipated.  I provided 54 minutes of non-face-to-face time during this encounter.   Ty Bernice Savant, [facility]   Session Time: 1:05 pm  Participation Level: Active  Behavioral Response: CasualAlertneutral  Type of Therapy: Individual Therapy  Treatment Goals addressed: client will engage in at least 80% of scheduled individual psychotherapy sessions   ProgressTowards Goals: Progressing  Interventions: CBT and Supportive  Summary: Robert Lutz is a 38 y.o. male who presents with Bipolar disorder. Robert Lutz presents to session alert and oriented; mood and affect adequate. Speech clear and coherent at normal rate and tone. Engaged and  cooperative to session. Assessment completed  02/10/2023 by Paige Cozart LCSW and currently engaged in OPT with that clinician, last seen 02/20/2024. Denies awareness of why to have been scheduled with this current Clinical research associate but agrees to be seen for today noting current stressors. Shares thoughts of difficulty navigating things that trigger me. Shares hx of incarceration and fights as a result of difficulty controlling his  anger. Shares events in which he has experienced altercations with others and denies desire to represent to jail as a result of difficulty controlling his emotions. Explores with therapist lack of coping skills and explores ability not be reactive towards his emotions. Explores with therapist use of STOP skill and delaying impulsive reactions and choose manner in which how he would like to respond. Thanks clinician and agrees to follow up with usual therapist. Denies safety concerns.   Suicidal/Homicidal: Nowithout intent/plan  Therapist Response: Therapist engaged Robert Lutz in [employer]. Assessed for current location and confidential setting. Assessed current stressor and sxs management. Provided safe space for Robert Lutz to share thoughts and feelings on current concerns. Provided supportive feedback and validated feelings. Provided support and encouragement and explored development of coping skills for management of anger and working through difficult feelings. Explored maladaptive behavior of responding on impulse to strong emotions and explores use of coping skills to delay immediate emotional reaction that has proven detrimental in the past. Encouraged use of skills to allow self to decrease severity of strong emotions and allow self to process thoughts and choose appropriate response. Educated on STOP skills to support with this. Reviewed session and instructed patient access to scheduled with typical therapist.   Plan: Return again in 4 weeks. With Paige Cozart LCSW  Diagnosis: Bipolar I disorder (HCC)  Collaboration of Care: Other None  Patient/Guardian was advised Release of Information must be obtained prior to any record release in order to collaborate their care with an outside provider. Patient/Guardian was advised if they have not already done so to contact the registration department to sign all necessary forms in order for us  to release information regarding their care.  Consent:  Patient/Guardian gives verbal consent for treatment and assignment of benefits for services provided during this visit. Patient/Guardian expressed understanding and agreed to proceed.   Ty Asal [place], [facility] 04/23/2024

## 2024-04-24 ENCOUNTER — Telehealth (HOSPITAL_COMMUNITY): Payer: MEDICAID | Admitting: Psychiatry

## 2024-04-24 ENCOUNTER — Encounter (HOSPITAL_COMMUNITY): Payer: Self-pay | Admitting: Psychiatry

## 2024-04-24 DIAGNOSIS — F319 Bipolar disorder, unspecified: Secondary | ICD-10-CM | POA: Diagnosis not present

## 2024-04-24 MED ORDER — QUETIAPINE FUMARATE 150 MG PO TABS: 150.0000 mg | ORAL_TABLET | Freq: Every day | ORAL | 3 refills | Status: DC

## 2024-04-24 NOTE — Progress Notes (Signed)
 BH MD/PA/NP OP Progress Note Virtual Visit via Video Note  I connected with Robert Lutz on 04/24/24 at  2:00 PM EDT by a video enabled telemedicine application and verified that I am speaking with the correct person using two identifiers.  Location: Patient: Home Provider: Clinic   I discussed the limitations of evaluation and management by telemedicine and the availability of in person appointments. The patient expressed understanding and agreed to proceed.  I provided 30 minutes of non-face-to-face time during this encounter.    04/24/2024 10:58 AM Robert Lutz  MRN:  [id number]  Chief Complaint: I have been learning to cope   HPI:38 year old male seen today for follow up psychiatric evaluation. He has a psychiatric history of bipolar disorder and schizophrenia. He is currently managed on Seroquel  100 mg nightly. He notes that his medication is somewhat effective in managing his psychiatric condition.  Today he was well-groomed, pleasant, cooperative, engaged in conversation.  He informed Clinical research associate that he has been learning to cope in therapy. He notes that at times he is triggered by negative people. He reports that at times ignorant people in his family and neighborhood irritates him. At times he notes that he feels pressured with the world but notes that he is able to cope by focusing on himself. He reports that he gains serenity through going to the gym and the sauna.   He reports that he is learning how to manage his emotions by reacting when he is not as angry.   Patient notes that his anxiety and depression are somewhat heightened. Today provider conducted a GAD 7 and patient scored a 12. Provider also conducted a PHQ 9 and patient scored a 12. He notes that he sleeps 4-5 hours. He describes having racing thoughts. Today he denies SI/HI/VAH or paranoia.   Patient notes that he has been having GI upset.  Today Seroquel  100 mg increased to 150 mg to help manage anxiety,  depression, and sleep. He will continue all other medications as prescribed.   No other concerns noted at this time.    Visit Diagnosis:    ICD-10-CM   1. Bipolar I disorder (HCC)  F31.9 QUEtiapine  150 MG TABS         Past Psychiatric History: bipolar disorder and schizophrenia  Past Medical History:  Past Medical History:  Diagnosis Date   Asthma    Chronic bronchitis    History of Helicobacter pylori infection 02/03/2020   History reviewed. No pertinent surgical history.  Family Psychiatric History: Notes that paternal and maternal family members have undiagnosed mental health conditions  Family History:  Family History  Problem Relation Age of Onset   Diabetes Mother    Diabetes Father     Social History:  Social History   Socioeconomic History   Marital status: Married    Spouse name: Not on file   Number of children: Not on file   Years of education: Not on file   Highest education level: Not on file  Occupational History   Not on file  Tobacco Use   Smoking status: Some Days    Current packs/day: 0.50    Types: Cigarettes   Smokeless tobacco: Never  Vaping Use   Vaping status: Never Used  Substance and Sexual Activity   Alcohol use: Not Currently   Drug use: Not Currently    Types: Marijuana   Sexual activity: Yes  Other Topics Concern   Not on file  Social History Narrative  Not on file   Social Drivers of Health   Financial Resource Strain: Not on file  Food Insecurity: Not on file  Transportation Needs: Not on file  Physical Activity: Not on file  Stress: Not on file  Social Connections: Not on file    Allergies: No Known Allergies  Metabolic Disorder Labs: No results found for: HGBA1C, MPG No results found for: PROLACTIN No results found for: CHOL, TRIG, HDL, CHOLHDL, VLDL, LDLCALC No results found for: TSH  Therapeutic Level Labs: No results found for: LITHIUM No results found for: VALPROATE No results  found for: CBMZ  Current Medications: Current Outpatient Medications  Medication Sig Dispense Refill   albuterol  (VENTOLIN  HFA) 108 (90 Base) MCG/ACT inhaler Inhale 1-2 puffs into the lungs every 6 (six) hours as needed for wheezing or shortness of breath. 18 g 0   amoxicillin  (AMOXIL ) 875 MG tablet Take 1 tablet (875 mg total) by mouth 2 (two) times daily. 20 tablet 0   famotidine  (PEPCID ) 20 MG tablet Take 1 tablet (20 mg total) by mouth 2 (two) times daily. 60 tablet 0   fluticasone  (FLONASE ) 50 MCG/ACT nasal spray Place 1 spray into both nostrils daily. 15.8 mL 0   guaifenesin  (ROBITUSSIN) 100 MG/5ML syrup Take 200 mg by mouth 3 (three) times daily as needed for cough.     loratadine  (CLARITIN ) 10 MG tablet Take 1 tablet (10 mg total) by mouth daily. 30 tablet 0   omeprazole  (PRILOSEC) 20 MG capsule Take 1 capsule (20 mg total) by mouth daily. 90 capsule 0   predniSONE  (DELTASONE ) 10 MG tablet Take 3 tablets (30 mg total) by mouth daily with breakfast. 15 tablet 0   promethazine -dextromethorphan (PROMETHAZINE -DM) 6.25-15 MG/5ML syrup Take 5 mLs by mouth 3 (three) times daily as needed for cough. 200 mL 0   QUEtiapine  150 MG TABS Take 150 mg by mouth at bedtime. 30 tablet 3   terbinafine  (LAMISIL ) 250 MG tablet Take 1 tablet (250 mg total) by mouth daily. 30 tablet 0   No current facility-administered medications for this visit.     Musculoskeletal: Strength & Muscle Tone: within normal limits, telehealth visit Gait & Station: normal, telehealth visit Patient leans: N/A  Psychiatric Specialty Exam: Review of Systems  There were no vitals taken for this visit.There is no height or weight on file to calculate BMI.  General Appearance: Well Groomed  Eye Contact:  Good  Speech:  Clear and Coherent and Normal Rate  Volume:  Normal  Mood:  Anxious,mild  Affect:  Appropriate and Congruent  Thought Process:  Coherent, Goal Directed, and Linear  Orientation:  Full (Time, Place, and  Person)  Thought Content: WDL and Logical   Suicidal Thoughts:  No  Homicidal Thoughts:  No  Memory:  Immediate;   Good Recent;   Good Remote;   Good  Judgement:  Good  Insight:  Good  Psychomotor Activity:  Normal  Concentration:  Concentration: Good and Attention Span: Good  Recall:  Good  Fund of Knowledge: Good  Language: Good  Akathisia:  No  Handed:  Right  AIMS (if indicated): not done  Assets:  Communication Skills Desire for Improvement Financial Resources/Insurance Housing Intimacy Leisure Time Physical Health Social Support  ADL's:  Intact  Cognition: WNL  Sleep:  Fair   Screenings: GAD-7    Flowsheet Row Video Visit from 04/24/2024 in [facility] Video Visit from 06/07/2023 in [facility] Video Visit from 03/28/2023 in [facility] Clinical Support from 12/27/2022 in [facility] Video Visit from 08/18/2021 in [facility]  Total GAD-7 Score 12 6 3 21 2    PHQ2-9    Flowsheet Row Video Visit from 04/24/2024 in [facility] Video Visit from 06/07/2023 in [facility] Video Visit from 03/28/2023 in [facility] Clinical Support from 12/27/2022 in [facility] Video Visit from 08/18/2021 in [facility]  PHQ-2 Total Score 3 0 1 5 0  PHQ-9 Total Score 12 3 2 13 2    Flowsheet Row ED from 01/15/2024 in [facility] Emergency Department at [facility] UC from 12/20/2023 in [facility] Care at [facility] UC from 08/24/2023 in [facility] at [facility]  C-SSRS RISK CATEGORY No Risk No Risk No Risk     Assessment and Plan: Patient endorses mild anxiety, depression, and poor sleep. Today Seroquel  100 mg increased to 150 mg to help manage  anxiety, depression, and sleep. He will continue all other medications as prescribed.   1. Bipolar I disorder (HCC)  Increased- QUEtiapine  150 MG TABS; Take 150 mg by mouth at bedtime.  Dispense: 30 tablet; Refill: 3 Follow-up in 3 Follow-up with therapy Zane FORBES Bach, NP 04/24/2024, 10:58 AM

## 2024-07-19 ENCOUNTER — Encounter (HOSPITAL_COMMUNITY): Payer: Self-pay | Admitting: Psychiatry

## 2024-07-19 ENCOUNTER — Telehealth (INDEPENDENT_AMBULATORY_CARE_PROVIDER_SITE_OTHER): Payer: MEDICAID | Admitting: Psychiatry

## 2024-07-19 DIAGNOSIS — F319 Bipolar disorder, unspecified: Secondary | ICD-10-CM | POA: Diagnosis not present

## 2024-07-19 MED ORDER — QUETIAPINE FUMARATE 150 MG PO TABS: 150.0000 mg | ORAL_TABLET | Freq: Every day | ORAL | 3 refills | Status: AC

## 2024-07-19 NOTE — Progress Notes (Signed)
 BH MD/PA/NP OP Progress Note Virtual Visit via Video Note  I connected with Robert Lutz on 07/19/2024 at  9:30 AM EST by a video enabled telemedicine application and verified that I am speaking with the correct person using two identifiers.  Location: Patient: Home Provider: Clinic   I discussed the limitations of evaluation and management by telemedicine and the availability of in person appointments. The patient expressed understanding and agreed to proceed.  I provided 30 minutes of non-face-to-face time during this encounter.    07/19/2024 9:49 AM Robert Lutz  MRN:  [id number]  Chief Complaint: I am going through some stuff and haven't started my medication   HPI:39 year old male seen today for follow up psychiatric evaluation. He has a psychiatric history of bipolar disorder and schizophrenia. He is currently managed on Seroquel  150 mg nightly. He notes that he has not started his new dose of Seroquel .   Today he was well-groomed, pleasant, cooperative, engaged in conversation.  He informed clinical research associate that he has been going through some stuff. He reports that life has been a roller coaster. Patient notes that finically things have been rough. He notes that at times he run out of food. He notes that he, his children, and his significant other have been ill. Patient notes that he has been caring for others before himself. He notes that he has been focusing on his children and household. He has not started his increased does of Seroquel .  Without his Seroquel  patient notes that he has been more irritable, distracted, has racing thoughts, and fluctuations of mood. He notes that he has been behind on his bills. Patient notes that the above worsens his anxiety and depression. Today provider conducted a GAD 7 and patient scored a at his last visit he scored an 8, at his last visit he scored a 12. Provider also conducted a PHQ 9 and patient scored a at his last visit he scored a 12, at his  last visit he scored a 12.  Today he denies SI/HI/VAH or paranoia. Patient notes that his sleep fluctuates. He also notes that his appetite has increased. He has gained a few pounds.  To cope patient notes that he has been attempting to go to the gym at 3:00 am to 8:00. At times he notes that he can't be consistent at the gym as he has responsibly. He also notes that he vapes nicotine. At this time he is not interested in nicorette patches or gum.   Today patient agreeable to restarting Seroquel  150 mg to help manage anxiety, depression, and sleep. He will continue all other medications as prescribed.   No other concerns noted at this time.    Visit Diagnosis:  No diagnosis found.      Past Psychiatric History: bipolar disorder and schizophrenia  Past Medical History:  Past Medical History:  Diagnosis Date   Asthma    Chronic bronchitis    History of Helicobacter pylori infection 02/03/2020   No past surgical history on file.  Family Psychiatric History: Notes that paternal and maternal family members have undiagnosed mental health conditions  Family History:  Family History  Problem Relation Age of Onset   Diabetes Mother    Diabetes Father     Social History:  Social History   Socioeconomic History   Marital status: Married    Spouse name: Not on file   Number of children: Not on file   Years of education: Not on file  Highest education level: Not on file  Occupational History   Not on file  Tobacco Use   Smoking status: Some Days    Current packs/day: 0.50    Types: Cigarettes   Smokeless tobacco: Never  Vaping Use   Vaping status: Never Used  Substance and Sexual Activity   Alcohol use: Not Currently   Drug use: Not Currently    Types: Marijuana   Sexual activity: Yes  Other Topics Concern   Not on file  Social History Narrative   Not on file   Social Drivers of Health   Tobacco Use: High Risk (04/24/2024)   Patient History    Smoking Tobacco Use:  Some Days    Smokeless Tobacco Use: Never    Passive Exposure: Not on file  Financial Resource Strain: Not on file  Food Insecurity: Not on file  Transportation Needs: Not on file  Physical Activity: Not on file  Stress: Not on file  Social Connections: Not on file  Depression (PHQ2-9): High Risk (04/24/2024)   Depression (PHQ2-9)    PHQ-2 Score: 12  Alcohol Screen: Not on file  Housing: Not on file  Utilities: Not on file  Health Literacy: Not on file    Allergies: No Known Allergies  Metabolic Disorder Labs: No results found for: HGBA1C, MPG No results found for: PROLACTIN No results found for: CHOL, TRIG, HDL, CHOLHDL, VLDL, LDLCALC No results found for: TSH  Therapeutic Level Labs: No results found for: LITHIUM No results found for: VALPROATE No results found for: CBMZ  Current Medications: Current Outpatient Medications  Medication Sig Dispense Refill   albuterol  (VENTOLIN  HFA) 108 (90 Base) MCG/ACT inhaler Inhale 1-2 puffs into the lungs every 6 (six) hours as needed for wheezing or shortness of breath. 18 g 0   amoxicillin  (AMOXIL ) 875 MG tablet Take 1 tablet (875 mg total) by mouth 2 (two) times daily. 20 tablet 0   famotidine  (PEPCID ) 20 MG tablet Take 1 tablet (20 mg total) by mouth 2 (two) times daily. 60 tablet 0   fluticasone  (FLONASE ) 50 MCG/ACT nasal spray Place 1 spray into both nostrils daily. 15.8 mL 0   guaifenesin  (ROBITUSSIN) 100 MG/5ML syrup Take 200 mg by mouth 3 (three) times daily as needed for cough.     loratadine  (CLARITIN ) 10 MG tablet Take 1 tablet (10 mg total) by mouth daily. 30 tablet 0   omeprazole  (PRILOSEC) 20 MG capsule Take 1 capsule (20 mg total) by mouth daily. 90 capsule 0   predniSONE  (DELTASONE ) 10 MG tablet Take 3 tablets (30 mg total) by mouth daily with breakfast. 15 tablet 0   promethazine -dextromethorphan (PROMETHAZINE -DM) 6.25-15 MG/5ML syrup Take 5 mLs by mouth 3 (three) times daily as needed for  cough. 200 mL 0   QUEtiapine  150 MG TABS Take 150 mg by mouth at bedtime. 30 tablet 3   terbinafine  (LAMISIL ) 250 MG tablet Take 1 tablet (250 mg total) by mouth daily. 30 tablet 0   No current facility-administered medications for this visit.     Musculoskeletal: Strength & Muscle Tone: within normal limits, telehealth visit Gait & Station: normal, telehealth visit Patient leans: N/A  Psychiatric Specialty Exam: Review of Systems  There were no vitals taken for this visit.There is no height or weight on file to calculate BMI.  General Appearance: Well Groomed  Eye Contact:  Good  Speech:  Clear and Coherent and Normal Rate  Volume:  Normal  Mood:  Anxious,mild  Affect:  Appropriate and Congruent  Thought Process:  Coherent, Goal Directed, and Linear  Orientation:  Full (Time, Place, and Person)  Thought Content: WDL and Logical   Suicidal Thoughts:  No  Homicidal Thoughts:  No  Memory:  Immediate;   Good Recent;   Good Remote;   Good  Judgement:  Good  Insight:  Good  Psychomotor Activity:  Normal  Concentration:  Concentration: Good and Attention Span: Good  Recall:  Good  Fund of Knowledge: Good  Language: Good  Akathisia:  No  Handed:  Right  AIMS (if indicated): not done  Assets:  Communication Skills Desire for Improvement Financial Resources/Insurance Housing Intimacy Leisure Time Physical Health Social Support  ADL's:  Intact  Cognition: WNL  Sleep:  Fair   Screenings: GAD-7    Flowsheet Row Video Visit from 04/24/2024 in [facility] Video Visit from 06/07/2023 in [facility] Video Visit from 03/28/2023 in [facility] Clinical Support from 12/27/2022 in [facility] Video Visit from 08/18/2021 in [facility]  Total GAD-7 Score 12 6 3 21 2    PHQ2-9    Flowsheet Row Video Visit from 04/24/2024 in [facility] Video Visit from 06/07/2023 in [facility] Video Visit from 03/28/2023 in [facility] Clinical Support from 12/27/2022 in [facility] Video Visit from 08/18/2021 in [facility]  PHQ-2 Total Score 3 0 1 5 0  PHQ-9 Total Score 12 3 2 13 2    Flowsheet Row ED from 01/15/2024 in [facility] Emergency Department at [facility] UC from 12/20/2023 in [facility] UC from 08/24/2023 in [facility])  C-SSRS RISK CATEGORY No Risk No Risk No Risk     Assessment and Plan: Patient endorses symptoms of hypomania, anxiety, and poor sleep.  He has been without his Seroquel .Today patient agreeable to restarting Seroquel  150 mg to help manage anxiety, depression, and sleep. He will continue all other medications as prescribed. 1. Bipolar I disorder (HCC)  Restart- QUEtiapine  150 MG TABS; Take 150 mg by mouth at bedtime.  Dispense: 30 tablet; Refill: 3 Follow-up in 2.5 Follow-up with therapy Zane FORBES Bach, NP 07/19/2024, 9:49 AM

## 2024-07-24 ENCOUNTER — Telehealth (HOSPITAL_COMMUNITY): Payer: MEDICAID | Admitting: Psychiatry

## 2024-08-12 ENCOUNTER — Telehealth (HOSPITAL_COMMUNITY): Payer: Self-pay | Admitting: Psychiatry

## 2024-08-12 NOTE — Telephone Encounter (Signed)
 Patient called and said there were a few things he needed to discuss with Dr. Harl. He refused to leave any details.  [phone] is the callback number. Please advise. Thank you.

## 2024-08-14 NOTE — Telephone Encounter (Signed)
 Provider attempted call patient 3 times without success.  Voicemail left for patient to return call.

## 2024-10-02 ENCOUNTER — Telehealth (HOSPITAL_COMMUNITY): Payer: MEDICAID | Admitting: Psychiatry
